# Patient Record
Sex: Female | Born: 1995 | Race: Black or African American | Hispanic: No | Marital: Single | Smoking: Never smoker
Health system: Southern US, Community
[De-identification: ages and names within clinical notes are randomized; demographics above are authoritative.]

## PROBLEM LIST (undated history)

## (undated) DIAGNOSIS — M419 Scoliosis, unspecified: Secondary | ICD-10-CM

## (undated) DIAGNOSIS — O24419 Gestational diabetes mellitus in pregnancy, unspecified control: Secondary | ICD-10-CM

## (undated) HISTORY — DX: Gestational diabetes mellitus in pregnancy, unspecified control: O24.419

## (undated) HISTORY — PX: NO PAST SURGERIES: SHX2092

---
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
## 2016-07-05 ENCOUNTER — Encounter (HOSPITAL_COMMUNITY): Payer: Self-pay | Admitting: *Deleted

## 2016-07-05 ENCOUNTER — Emergency Department (HOSPITAL_COMMUNITY)
Admission: EM | Admit: 2016-07-05 | Discharge: 2016-07-06 | Disposition: A | Discharge status: Home / Self Care | Payer: Self-pay | Attending: Emergency Medicine | Admitting: Emergency Medicine

## 2016-07-05 DIAGNOSIS — M4124 Other idiopathic scoliosis, thoracic region: Secondary | ICD-10-CM | POA: Insufficient documentation

## 2016-07-05 DIAGNOSIS — M546 Pain in thoracic spine: Secondary | ICD-10-CM

## 2016-07-05 DIAGNOSIS — X501XXA Overexertion from prolonged static or awkward postures, initial encounter: Secondary | ICD-10-CM | POA: Insufficient documentation

## 2016-07-05 DIAGNOSIS — M62830 Muscle spasm of back: Secondary | ICD-10-CM | POA: Insufficient documentation

## 2016-07-05 DIAGNOSIS — Y9389 Activity, other specified: Secondary | ICD-10-CM | POA: Insufficient documentation

## 2016-07-05 DIAGNOSIS — Y999 Unspecified external cause status: Secondary | ICD-10-CM | POA: Insufficient documentation

## 2016-07-05 DIAGNOSIS — Y929 Unspecified place or not applicable: Secondary | ICD-10-CM | POA: Insufficient documentation

## 2016-07-05 HISTORY — DX: Scoliosis, unspecified: M41.9

## 2016-07-05 NOTE — ED Triage Notes (Signed)
History of scoliosis and stretched her back today and now it is hurting

## 2016-07-06 MED ORDER — CYCLOBENZAPRINE HCL 10 MG PO TABS
5.0000 mg | ORAL_TABLET | Freq: Once | ORAL | Status: AC
  Administered 2016-07-06: 5 mg via ORAL
  Filled 2016-07-06: qty 1

## 2016-07-06 MED ORDER — CYCLOBENZAPRINE HCL 5 MG PO TABS: 5.0000 mg | ORAL_TABLET | Freq: Three times a day (TID) | ORAL | 0 refills | Status: DC | PRN

## 2016-07-06 MED ORDER — NAPROXEN 375 MG PO TABS: 375.0000 mg | ORAL_TABLET | Freq: Two times a day (BID) | ORAL | 0 refills | Status: DC

## 2016-07-06 NOTE — ED Provider Notes (Signed)
[facility]-EMERGENCY DEPT Provider Note   CSN: [id number] Arrival date & time: 07/05/16  2308     History   Chief Complaint Chief Complaint  Patient presents with  . Back Pain    HPI Meagan Richardson is a 20 y.o. female who presents to the ED with back pain. Patient reports that she has scoliosis and has chronic back pain. When she was 13 she went to her PCP and was referred to ortho to discuss possible surgery but patient states her mother never took her because she didn't want her to have surgery. Patient reports that today at work she was stretching   The history is provided by the patient. No language interpreter was used.  Back Pain   This is a new problem. The current episode started 3 to 5 hours ago. The problem has not changed since onset.The pain is present in the thoracic spine. The quality of the pain is described as stabbing and shooting. The pain does not radiate. The pain is at a severity of 10/10. The symptoms are aggravated by bending and twisting. The pain is the same all the time. Pertinent negatives include no chest pain, no fever, no headaches, no abdominal pain, no bowel incontinence, no bladder incontinence, no dysuria and no pelvic pain. She has tried nothing for the symptoms.    Past Medical History:  Diagnosis Date  . Scoliosis     There are no active problems to display for this patient.   History reviewed. No pertinent surgical history.  OB History    No data available       Home Medications    Prior to Admission medications   Medication Sig Start Date End Date Taking? Authorizing Provider  cyclobenzaprine (FLEXERIL) 5 MG tablet Take 1 tablet (5 mg total) by mouth 3 (three) times daily as needed for muscle spasms. 07/06/16   Hope Orlene OchM Neese, NP  naproxen (NAPROSYN) 375 MG tablet Take 1 tablet (375 mg total) by mouth 2 (two) times daily. 07/06/16   Hope Orlene OchM Neese, NP    Family History No family history on file.  Social History Social History    Substance Use Topics  . Smoking status: Never Smoker  . Smokeless tobacco: Never Used  . Alcohol use No     Allergies   Review of patient's allergies indicates no known allergies.   Review of Systems Review of Systems  Constitutional: Negative for chills and fever.  HENT: Negative.   Eyes: Negative for visual disturbance.  Respiratory: Negative for cough and wheezing.   Cardiovascular: Negative for chest pain.  Gastrointestinal: Negative for abdominal pain, bowel incontinence, nausea and vomiting.  Genitourinary: Negative for bladder incontinence, dysuria, pelvic pain and urgency.  Musculoskeletal: Positive for back pain.  Neurological: Negative for syncope and headaches.  Psychiatric/Behavioral: Negative for confusion.     Physical Exam Updated Vital Signs BP 133/79 (BP Location: Left Arm)   Pulse 82   Temp 98.3 F (36.8 C) (Oral)   Resp 18   Ht 5\' 2"  (1.575 m)   Wt 49.9 kg   LMP 06/28/2016   SpO2 100%   BMI 20.12 kg/m   Physical Exam  Constitutional: She is oriented to person, place, and time. She appears well-developed and well-nourished. No distress.  HENT:  Head: Normocephalic and atraumatic.  Right Ear: Tympanic membrane normal.  Left Ear: Tympanic membrane normal.  Nose: Nose normal.  Mouth/Throat: Uvula is midline, oropharynx is clear and moist and mucous membranes are normal.  Eyes:  EOM are normal.  Neck: Normal range of motion. Neck supple.  Cardiovascular: Normal rate and regular rhythm.   Pulmonary/Chest: Effort normal. She has no wheezes. She has no rales.  Abdominal: Soft. Bowel sounds are normal. There is no tenderness.  Musculoskeletal: Normal range of motion.       Back:  Scoliosis of the thoracic spine area with muscle spasm noted on the right. Tender with palpation.   Neurological: She is alert and oriented to person, place, and time. She has normal strength. No cranial nerve deficit or sensory deficit. Gait normal.  Reflex Scores:       Bicep reflexes are 2+ on the right side and 2+ on the left side.      Brachioradialis reflexes are 2+ on the right side and 2+ on the left side.      Patellar reflexes are 2+ on the right side and 2+ on the left side. Skin: Skin is warm and dry.  Psychiatric: She has a normal mood and affect. Her behavior is normal.  Nursing note and vitals reviewed.    ED Treatments / Results  Labs (all labs ordered are listed, but only abnormal results are displayed) Labs Reviewed - No data to display   Radiology No results found.  Procedures Procedures (including critical care time)  Medications Ordered in ED Medications  cyclobenzaprine (FLEXERIL) tablet 5 mg (5 mg Oral Given 07/06/16 0037)     Initial Impression / Assessment and Plan / ED Course  I have reviewed the triage vital signs and the nursing notes.  Clinical Course  20 y.o. female with right side thoracic back pain and scoliosis stable for d/c without neuro deficits. Will treat for muscle spasm and she will f/u with ortho for further evaluation of her scoliosis and chronic back pain. She will return here as needed.   Final Clinical Impressions(s) / ED Diagnoses   Final diagnoses:  Acute right-sided thoracic back pain  Other idiopathic scoliosis, thoracic region  Muscle spasm of back    New Prescriptions Discharge Medication List as of 07/06/2016 12:34 AM    START taking these medications   Details  cyclobenzaprine (FLEXERIL) 5 MG tablet Take 1 tablet (5 mg total) by mouth 3 (three) times daily as needed for muscle spasms., Starting Sun 07/06/2016, [address]Print         Hope M Neese, NP 07/07/16 [id number]    Shon Batonourtney F Horton, MD 07/09/16 [phone]

## 2016-07-06 NOTE — Discharge Instructions (Signed)
Do not drive while taking the muscle relaxant as it will make you sleepy. °

## 2016-08-28 ENCOUNTER — Other Ambulatory Visit: Payer: Self-pay | Admitting: Neurosurgery

## 2016-08-28 DIAGNOSIS — M41125 Adolescent idiopathic scoliosis, thoracolumbar region: Secondary | ICD-10-CM

## 2016-09-10 ENCOUNTER — Other Ambulatory Visit: Payer: Self-pay | Admitting: Neurosurgery

## 2016-09-10 DIAGNOSIS — M41125 Adolescent idiopathic scoliosis, thoracolumbar region: Secondary | ICD-10-CM

## 2016-09-12 ENCOUNTER — Ambulatory Visit
Admission: RE | Admit: 2016-09-12 | Discharge: 2016-09-12 | Disposition: A | Discharge status: Home / Self Care | Payer: Medicaid Other | Source: Ambulatory Visit | Attending: Neurosurgery | Admitting: Neurosurgery

## 2016-09-12 DIAGNOSIS — M41125 Adolescent idiopathic scoliosis, thoracolumbar region: Secondary | ICD-10-CM

## 2016-09-23 ENCOUNTER — Institutional Professional Consult (permissible substitution): Payer: Medicaid Other | Admitting: Internal Medicine

## 2016-10-09 ENCOUNTER — Other Ambulatory Visit (INDEPENDENT_AMBULATORY_CARE_PROVIDER_SITE_OTHER): Payer: Self-pay

## 2016-10-09 ENCOUNTER — Encounter: Payer: Self-pay | Admitting: Internal Medicine

## 2016-10-09 ENCOUNTER — Ambulatory Visit (INDEPENDENT_AMBULATORY_CARE_PROVIDER_SITE_OTHER)
Admission: RE | Admit: 2016-10-09 | Discharge: 2016-10-09 | Disposition: A | Discharge status: Home / Self Care | Payer: Self-pay | Source: Ambulatory Visit | Attending: Internal Medicine | Admitting: Internal Medicine

## 2016-10-09 ENCOUNTER — Ambulatory Visit (INDEPENDENT_AMBULATORY_CARE_PROVIDER_SITE_OTHER): Payer: Self-pay | Admitting: Internal Medicine

## 2016-10-09 VITALS — BP 116/70 | HR 65 | Ht 62.0 in | Wt 105.6 lb

## 2016-10-09 DIAGNOSIS — R0602 Shortness of breath: Secondary | ICD-10-CM

## 2016-10-09 LAB — CBC WITH DIFFERENTIAL/PLATELET
BASOS PCT: 1.3 % (ref 0.0–3.0)
Basophils Absolute: 0.1 10*3/uL (ref 0.0–0.1)
EOS PCT: 1 % (ref 0.0–5.0)
Eosinophils Absolute: 0.1 10*3/uL (ref 0.0–0.7)
HCT: 43.7 % (ref 36.0–49.0)
Hemoglobin: 14.2 g/dL (ref 12.0–16.0)
LYMPHS ABS: 2.2 10*3/uL (ref 0.7–4.0)
Lymphocytes Relative: 30.7 % (ref 24.0–48.0)
MCHC: 32.6 g/dL (ref 31.0–37.0)
MCV: 81.6 fl (ref 78.0–98.0)
MONOS PCT: 6.3 % (ref 3.0–12.0)
Monocytes Absolute: 0.4 10*3/uL (ref 0.1–1.0)
NEUTROS PCT: 60.7 % (ref 43.0–71.0)
Neutro Abs: 4.3 10*3/uL (ref 1.4–7.7)
Platelets: 315 10*3/uL (ref 150.0–575.0)
RBC: 5.35 Mil/uL (ref 3.80–5.70)
RDW: 14.7 % (ref 11.4–15.5)
WBC: 7.1 10*3/uL (ref 4.5–13.5)

## 2016-10-09 LAB — BASIC METABOLIC PANEL
BUN: 9 mg/dL (ref 6–23)
CHLORIDE: 109 meq/L (ref 96–112)
CO2: 25 meq/L (ref 19–32)
Calcium: 9.6 mg/dL (ref 8.4–10.5)
Creatinine, Ser: 0.94 mg/dL (ref 0.40–1.20)
GFR: 98.24 mL/min (ref >60.00)
GLUCOSE: 93 mg/dL (ref 70–99)
POTASSIUM: 3.7 meq/L (ref 3.5–5.1)
Sodium: 138 mEq/L (ref 135–145)

## 2016-10-09 LAB — BRAIN NATRIURETIC PEPTIDE: PRO B NATRI PEPTIDE: 12 pg/mL (ref 0.0–100.0)

## 2016-10-09 LAB — TSH: TSH: 0.98 u[IU]/mL (ref 0.40–5.00)

## 2016-10-09 NOTE — Progress Notes (Signed)
Subjective:     Patient ID: Meagan Richardson, female   DOB: 10/05/1995,    MRN: [id number]  HPI   [age] yobf never smoker with new onset sob 9th grade assoc with back pain referred to pulmonary clinic 10/09/2016 by Dr   Aquilla HackerGhobrial for doe.    10/09/2016 1st [facility] office visit/ Courtny Bennison   Chief Complaint  Patient presents with  . Pulmonary Consult    Referred by Dr. Dorinda HillGeorge Ghobrial. Pt c/o SOB since Oct 2017. She states SOB comes and goes and bothers her most when she stands.  She has also had some tingling in her legs.   onset during 9th grade with back pain [place], [place] re back and new sob when stand up variable At onset noted that sob occurred for first time in Oct 2017 with ? Pulled muscle and has happened repeatedly since then but avg once a month,  Not sure it ever happens with exertion because stopped exerting due to back problems but able to make it up steps fine/ no noct symptoms  No obvious day to day or daytime variability or assoc excess/ purulent sputum or mucus plugs or hemoptysis or cp or chest tightness, subjective wheeze or overt sinus or hb symptoms. No unusual exp hx or h/o childhood pna/ asthma or knowledge of premature birth.  Sleeping ok without nocturnal  or early am exacerbation  of respiratory  c/o's or need for noct saba. Also denies any obvious fluctuation of symptoms with weather or environmental changes or other aggravating or alleviating factors except as outlined above   Current Medications, Allergies, Complete Past Medical History, Past Surgical History, Family History, and Social History were reviewed in [facility] electronic [facility] record.  ROS  The following are not active complaints unless bolded sore throat, dysphagia, dental problems, itching, sneezing,  nasal congestion or excess/ purulent secretions, ear ache,   fever, chills, sweats, unintended wt loss, classically pleuritic or exertional cp,  orthopnea pnd or leg swelling, presyncope,  palpitations, abdominal pain, anorexia, nausea, vomiting, diarrhea  or change in bowel or bladder habits, change in stools or urine, dysuria,hematuria,  rash, arthralgias, visual complaints, headache, numbness, weakness or ataxia or problems with walking or coordination,  change in mood/affect or memory.          Review of Systems     Objective:   Physical Exam  amb bf nad/ ? Belle affect?    Wt Readings from Last 3 Encounters:  10/09/16 105 lb 9.6 oz (47.9 kg) (9 %, Z= -1.33)*  07/05/16 110 lb (49.9 kg) (17 %, Z= -0.97)*   * Growth percentiles are based on CDC 2-20 Years data.    Vital signs reviewed - Note on arrival 02 sats  95% on RA though difficult to read thru nails     HEENT: nl dentition, turbinates, and oropharynx. Nl external ear canals without cough reflex   NECK :  without JVD/Nodes/TM/ nl carotid upstrokes bilaterally   LUNGS: no acc muscle use,  Nl contour chest which is clear to A and P bilaterally without cough on insp or exp maneuvers   CV:  RRR  no s3 or murmur or increase in P2, nad no edema   ABD:  soft and nontender with nl inspiratory excursion in the supine position. No bruits or organomegaly appreciated, bowel sounds nl  MS:  Nl gait/ ext warm without deformities, calf tenderness, cyanosis or clubbing No obvious joint restrictions but obvious thoracic scoliosis   SKIN: warm and  dry without lesions    NEURO:  alert, approp, nl sensorium with  no motor or cerebellar deficits apparent.     CXR PA and Lateral:   10/09/2016 :    I personally reviewed images and agree with radiology impression as follows:    1. No active cardiopulmonary disease. No evidence of pneumonia or pulmonary edema. Lung volumes are normal. 2. Prominent scoliosis of the thoracic spine   Labs ordered/ reviewed:      Chemistry      Component Value Date/Time   NA 138 10/09/2016 1220   K 3.7 10/09/2016 1220   CL 109 10/09/2016 1220   CO2 25 10/09/2016 1220   BUN 9  10/09/2016 1220   CREATININE 0.94 10/09/2016 1220      Component Value Date/Time   CALCIUM 9.6 10/09/2016 1220        Lab Results  Component Value Date   WBC 7.1 10/09/2016   HGB 14.2 10/09/2016   HCT 43.7 10/09/2016   MCV 81.6 10/09/2016   PLT 315.0 10/09/2016     Lab Results  Component Value Date   DDIMER <0.19 10/09/2016      Lab Results  Component Value Date   TSH 0.98 10/09/2016     Lab Results  Component Value Date   PROBNP 12.0 10/09/2016          Assessment:

## 2016-10-09 NOTE — Patient Instructions (Addendum)
Please remember to go to the lab and x-ray department downstairs in the basement  for your tests - we will call you with the results when they are available.     Please schedule a follow up office visit in 4 weeks, sooner if needed with full pfts on return

## 2016-10-10 ENCOUNTER — Telehealth: Payer: Self-pay | Admitting: Internal Medicine

## 2016-10-10 LAB — D-DIMER, QUANTITATIVE: D-Dimer, Quant: <0.19 mcg/mL FEU (ref <0.50)

## 2016-10-10 NOTE — Assessment & Plan Note (Addendum)
10/09/2016  Walked RA x 3 laps @ 185 ft each stopped due to  End of study, rapid pace, no sob, unable to read sats due to nails - Spirometry 10/09/2016  FEV1 1.52 (55%)  Ratio 58 s rx but poor effort/ f/v not physiologic   Although she clearly has thoracic scoliosis, there is no basis for her complaint of sob on standing that resolves sitting and is not reproducible nor does it appear to affect ex tolerance so Symptoms are markedly disproportionate to objective findings and not clear this is a lung problem but pt does appear to have difficult airway management issues.   The differential diagnosis of difficult to control airways disorders is extensive with no quick and easy answers but easy to remember because it consists of 13 A's,  Two Bs and one C: 1. Adherence, always a challenge and the leading suspect.> not an issue here since not on meds 2. Acid reflux disease, with the greater proportion of pulmonary patients with no overt heartburn symptoms, and no easy way to treat non-acid reflux> unlikely clinically  3. Ace inhibitor use>  n/a 4. Active sinus dz, best addressed by a sinus ct> unlikely s assoc cough  5. Active smoking,  Usually sureptitious in this setting> n/a 6. Allergic diseases, usually with a hx dating back to childhood with prominent allergic rhinitis features in up 90% of pts> unlikely s assoc cough  7. Aspiration, a perennial problem in the elderly or other patients at risk> unlikely s assoc cough  8. Allergic Bronchopulmonary Aspergillosis, associated with IgE's in the thousands> unlikely s asthma hx  9. Alpha one Antitrypsin deficiency, a must screen in patients with chronic airflow obstruction syndromes out of proportion to smoking history > n/a 10. Adverse effect of inhalers, especially DPI's and especially with poor inhaler technique> n/a 11  ? Anxiety/depression> deconditioning  > usually at the bottom of this list of usual suspects but should be much higher on this pt's based on  Hx.  12. A bunch of PE's ie moderately large clot burden, a few small ones peripherally can cause pleuritic cp syndromes but not unexplained dyspnea> D dimer nl - while  A nl valute  may miss small peripheral pe, the clot burden with sob is moderately high and the d dimer has a very high neg pred value in this setting   13 Anemia or Thyroid disorders, easily excluded with standard labs but frequently overlooked in the chronically symptomatic/ frequent return pt > ruled out by today's labs Two B's 1. Bronchiectasis:  Pos CT is the sine que non here 2  Beta blocker effects:  Coreg and Timolol use are pervasive in the adult population and both have significant spillover effects on the airways> n/a One C 1. Congestive heart failure,easily  ruled out now with BNP level of < 100 when symptomatic    She needs to be more active and return for formal pfts in one month to sort out restrictive vs obst components to her pfts and repeat walking sats with accurate oximetry   Total time devoted to counseling  > 50 % of initial 60 min office visit:  review case with pt/Mom discussion of options/alternatives/ personally creating written customized instructions  in presence of pt  then going over those specific  Instructions directly with the pt including how to use all of the meds but in particular covering each new medication in detail and the difference between the maintenance= "automatic" meds and the prns using an  action plan format for the latter (If this problem/symptom => do that organization reading Left to right).  Please see AVS from this visit for a full list of these instructions which I personally wrote for this pt and  are unique to this visit.

## 2016-10-10 NOTE — Telephone Encounter (Signed)
Spoke with the pt's mother and notified of lab and cxr results

## 2016-10-10 NOTE — Telephone Encounter (Signed)
Last ov note has been faxed to [facility].  Nothing further needed.

## 2016-10-10 NOTE — Progress Notes (Signed)
Spoke with pt's mother and notified of results per Dr. Wert. She verbalized understanding and denied any questions. 

## 2016-10-10 NOTE — Progress Notes (Signed)
LMTCB

## 2016-10-10 NOTE — Progress Notes (Signed)
Already LMTCB

## 2016-11-25 ENCOUNTER — Ambulatory Visit: Payer: Medicaid Other | Admitting: Internal Medicine

## 2016-12-18 ENCOUNTER — Ambulatory Visit: Payer: Medicaid Other | Attending: Neurosurgery | Admitting: Physical Therapy

## 2016-12-18 ENCOUNTER — Encounter: Payer: Self-pay | Admitting: Physical Therapy

## 2016-12-18 DIAGNOSIS — G8929 Other chronic pain: Secondary | ICD-10-CM | POA: Insufficient documentation

## 2016-12-18 DIAGNOSIS — M62830 Muscle spasm of back: Secondary | ICD-10-CM | POA: Insufficient documentation

## 2016-12-18 DIAGNOSIS — R293 Abnormal posture: Secondary | ICD-10-CM | POA: Insufficient documentation

## 2016-12-18 DIAGNOSIS — M6281 Muscle weakness (generalized): Secondary | ICD-10-CM | POA: Insufficient documentation

## 2016-12-18 DIAGNOSIS — M5440 Lumbago with sciatica, unspecified side: Secondary | ICD-10-CM | POA: Insufficient documentation

## 2016-12-18 DIAGNOSIS — M546 Pain in thoracic spine: Secondary | ICD-10-CM

## 2016-12-18 NOTE — Therapy (Signed)
[facility] Outpatient Rehabilitation [facility] [address] [place], [place], [address] Phone: [phone]   Fax:  [phone]  Physical Therapy Evaluation  Patient Details  Name: Meagan Richardson MRN: [id number] Date of Birth: [birth year] Referring Provider: Darryll Capers MD  Encounter Date: 12/18/2016      PT End of Session - 12/18/16 1607    Visit Number 1   Number of Visits 16   Date for PT Re-Evaluation 02/12/17   Authorization Type Medicaid   Authorization - Visit Number 1   Authorization - Number of Visits 8   PT Start Time 0300   PT Stop Time 0406   PT Time Calculation (min) 66 min   Activity Tolerance Patient tolerated treatment well   Behavior During Therapy [facility] for tasks assessed/performed      Past Medical History:  Diagnosis Date  . Scoliosis     History reviewed. No pertinent surgical history.  There were no vitals filed for this visit.       Subjective Assessment - 12/18/16 1508    Subjective I have pain every time I bend down or pick stuff up from ground.  I stopped working due to my pain in my back.   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  Got fitted for a brace that she was supporsed to use for 18 hours a day, but MD said not to wear it if it was bothering. her   Patient is accompained by: Family member  mom   Limitations Sitting   How long can you sit comfortably? < 5 minutes   How long can you stand comfortably? 15 -30 minutes   How long can you walk comfortably? 15- 30 minutes   Diagnostic tests x rays < MRI   Currently in Pain? Yes   Pain Score 7    Pain Location Back   Pain Orientation Right   Pain Descriptors / Indicators Aching;Tightness   Pain Type Chronic pain   Pain Onset More than a month ago   Pain Frequency Intermittent   Aggravating Factors  sitting for long time , bending over, picking up boxes    Pain Relieving Factors medicine            [facility] PT Assessment - 12/18/16 1515      Assessment   Medical Diagnosis scoliosis with lumbar and thoracic curves   Referring Provider Darryll Capers MD   Onset Date/Surgical Date --  > 2 years ago   Hand Dominance Right   Next MD Visit August 2018   Prior Therapy NONE     Precautions   Precautions None     Restrictions   Weight Bearing Restrictions No     Balance Screen   Has the patient fallen in the past 6 months No   Has the patient had a decrease in activity level because of a fear of falling?  No   Is the patient reluctant to leave their home because of a fear of falling?  No     Prior Function   Level of Independence Independent   Vocation Other (comment)  not working presently     Copy Status Within Functional Limits for tasks assessed     Observation/Other Assessments   Observations Pt sacral sits and leans to left side compressing right thoracic side   Focus on Therapeutic Outcomes (FOTO)  FOTO INtake 74%  limitation 26%  predicted  24%     Posture/Postural Control   Posture  Comments ectomorphic body type      AROM   Overall AROM  Deficits   Lumbar Flexion 50  painful   Lumbar Extension 25   Lumbar - Right Side Bend 21   Lumbar - Left Side Bend 40   Lumbar - Right Rotation 75%   Lumbar - Left Rotation 60%     Strength   Overall Strength Deficits   Overall Strength Comments grossly 4 to 4+/5 in UE and LE strength limited by pain     Palpation   Spinal mobility hypermobility of spine   Palpation comment pt with spasm over thoracic and lumbar paraspinal with increased spasm over right thoracic paraspinal and tenderness over right lumbosacral area.  Back scratch test. R over left = 2 inches. L overR fingertou                   [facility] Adult PT Treatment/Exercise - 12/18/16 1515      Self-Care   Self-Care Other Self-Care Comments   Other Self-Care Comments  explanation of scoliosis and need for lengthening instead of compressive exercises for strength.  Pt shown examples,   also educated on trigger point dry needling. and given handouts.   intial posture     Lumbar Exercises: Stretches   Quadruped Mid Back Stretch 3 reps;30 seconds   Quadruped Mid Back Stretch Limitations relief of pain     Lumbar Exercises: Sidelying   Other Sidelying Lumbar Exercises right side plank with breathing into left thoracic concavity x 20 with 10 second hold  spine aligned          Trigger Point Dry Needling - 12/18/16 1544    Consent Given? Yes   Education Handout Provided Yes   Muscles Treated Upper Body Longissimus  Erector spinae T-7 to 12   Longissimus Response Twitch response elicited;Palpable increased muscle length  right side only lumbo sacral right twitch response              PT Education - 12/18/16 1615    Education provided Yes   Education Details POC Explanation of findings, education on trigger point dry needling, childs pose and 1/2 plank on right side only with explanation of lengthening for scoliosis, posture/sitting and standing.    Person(s) Educated Patient   Methods Explanation;Demonstration;Verbal cues;Handout   Comprehension Verbalized understanding;Returned demonstration          PT Short Term Goals - 12/18/16 1621      PT SHORT TERM GOAL #1   Title "Independent with initial HEP   Baseline 01-15-17   No knowledge   Time 4   Period Weeks   Status New     PT SHORT TERM GOAL #2   Title "Report pain decrease from 7/10 to  4 /10. at rest when sitting for more than 30 minutes   Baseline Pt cannot sit more than 5 minutes without 5-7/ pain and needing to shift   Time 4   Period Weeks   Status New     PT SHORT TERM GOAL #3   Title Pt will be introduced to Pilates for strengthening and lengthening of scoliotic curve   Baseline No knowledge of strengthening and lengthening program   Time 4   Period Weeks   Status New           PT Long Term Goals - 12/18/16 1607      PT LONG TERM GOAL #1   Title "Demonstrate and verbalize  techniques to reduce the risk of  re-injury including: lifting, posture, body mechanics. 02-12-17   Baseline Pt sits with sacral sitting and forward head and compressed right side while leaning on left for comfort   Time 8   Period Weeks   Status New     PT LONG TERM GOAL #2   Title "Pt will be independent with advanced HEP.    Baseline NO knowledge   Time 8   Period Weeks   Status New     PT LONG TERM GOAL #3   Title "Demonstrate understanding of proper sitting posture and be more conscious of position and posture throughout the day.    Baseline Pt has typical teenage posture with flexion and compression of spine   Time 8   Period Weeks   Status New     PT LONG TERM GOAL #4   Title Pt will be able to manage pain using pain management strategies to prepare sitting and driving for greater than 1 hour   Baseline Pt has no knowledge of pain management   Time 8   Period Weeks   Status New     PT LONG TERM GOAL #5   Title Pt will tolerate standing and walking for 2 hours in order to return to work environment   Baseline Pt can only stand for 15 to 30 minutes without 7/10 pain   Time 8   Period Weeks   Status New     Additional Long Term Goals   Additional Long Term Goals Yes     PT LONG TERM GOAL #6   Title "FOTO will improve from 26%limitation   to  20%   indicating improved functional mobility   Baseline EVAL 26% limitation   Time 8   Period Weeks   Status New     PT LONG TERM GOAL #7   Title Pt will be familiar with community wellness for continuing strength post PT Rehab for scoliosis   Baseline Pt has no knowleged of proper exercise for scoliosis   Time 8   Period Weeks   Status New               Plan - 12/18/16 1707    Clinical Impression Statement 21 yo presents with low complexity evaluation of thoracic and low back pain due to dextroscoliosis of thoracic spine and levo scoliosisi of lumbar curver with elevated Quadratus lumborum of right side.  CPT code (  thoracic pain M54.6) and lumbar M54.5. Pt has pain when bending over with limited AROM and when picking up items.  Pt had to quit warehouse job due to pain.  Pt also has spasm of bil paraspinals with marked spasm R> L.  Pt exhibits symptoms of hypermobility and will benefit from a strengthening program to maximize function.  Pt exhibits poor posture with forward head and anterior tilt of pelvis and sacral sits and compresses right side by leaning on left side. Pt can only sit 5 minutes without shifting due to pain and walk/stand 15 - because of pain.  Pt will benefit from strengtheing with Pilates for lifelong fitness. and skilled PT with dry needling to alleviate muscle spasms.     Rehab Potential Good   PT Frequency 2x / week   PT Duration 8 weeks   PT Treatment/Interventions Taping;Dry needling;Manual techniques;Passive range of motion;Patient/family education;Neuromuscular re-education;Therapeutic exercise;Therapeutic activities;Electrical Stimulation;Moist Heat;Cryotherapy;Iontophoresis /ml Dexamethasone;Ultrasound   PT Next Visit Plan asses dry needling.  progress exercises for strengthening   PT Home Exercise Plan childs pose and  right side plank with breathing in to concavitiy   Consulted and Agree with Plan of Care Patient      Patient will benefit from skilled therapeutic intervention in order to improve the following deficits and impairments:  Pain, Improper body mechanics, Postural dysfunction, Increased muscle spasms, Hypermobility, Decreased strength, Decreased range of motion  Visit Diagnosis: Pain in thoracic spine  Chronic left-sided low back pain with sciatica, sciatica laterality unspecified  Abnormal posture  Muscle weakness (generalized)  Muscle spasm of back      G-Codes - Jan 05, 2017 1606    Functional Assessment Tool Used (Outpatient Only) FOTO   Functional Limitation Changing and maintaining body position   Changing and Maintaining Body Position Current  Status (Z6109) At least 20 percent but less than 40 percent impaired, limited or restricted  26%   Changing and Maintaining Body Position Goal Status (U0454) At least 1 percent but less than 20 percent impaired, limited or restricted  20%       Problem List Patient Active Problem List   Diagnosis Date Noted  . Shortness of breath 10/09/2016    Garen Lah, PT Certified Exercise Expert for the Aging Adult  01/05/2017 5:24 PM Phone: [phone] Fax: [phone]  [facility] Outpatient Rehabilitation [facility] [address] [place], [place], [address] Phone: [phone]   Fax:  [phone]  Name: Meagan Richardson MRN: [id number] Date of Birth: 1995-09-24

## 2016-12-18 NOTE — Patient Instructions (Addendum)
Posture Tips DO: - stand tall and erect - keep chin tucked in - keep head and shoulders in alignment - check posture regularly in mirror or large window - pull head back against headrest in car seat;  Change your position often.  Sit with lumbar support. DON'T: - slouch or slump while watching TV or reading - sit, stand or lie in one position  for too long;  Sitting is especially hard on the spine so if you sit at a desk/use the computer, then stand up often!   Copyright  VHI. All rights reserved.  Posture - Standing   Good posture is important. Avoid slouching and forward head thrust. Maintain curve in low back and align ears over shoul- ders, hips over ankles.  Pull your belly button in toward your back bone. Stand with even weight in heel and toes. Ribs lifted up like a golden thread from your sternum to the sky.  Chin tucked down.   Copyright  VHI. All rights reserved.  Posture - Sitting   Sit upright, head facing forward. Try using a roll to support lower back. Keep shoulders relaxed, and avoid rounded back. Keep hips level with knees. Avoid crossing legs for long periods. Sit on sit bones not tail bone.  Do not sit one side all the time . Switch sides from leaning to left   Copyright  VHI. All rights reserved.   Oblique Abdominal Side Plank: Lowering (Eccentric)    Lie on side on feet and elbow right side with elbow,. Lift trunk. Breathe into concavity of your upper back.  Fill your lungs with air and hold for 5-10 seconds.   _2 x _10_ reps per set, _2__ times  per day, ___ days per week. Progress to lifting arm.  Copyright  VHI. All rights reserved.   BACK: Child's Pose (Sciatica)    Sit in knee-chest position and reach arms forward. Separate knees for comfort. Hold position  For 30 seconds Repeat __3_ times. Do __2_ times per day. Or use when  Back is aching.  Copyright  VHI. All rights reserved.   Trigger Point Dry Needling  . What is Trigger Point Dry Needling  (DN)? o DN is a physical therapy technique used to treat muscle pain and dysfunction. Specifically, DN helps deactivate muscle trigger points (muscle knots).  o A thin filiform needle is used to penetrate the skin and stimulate the underlying trigger point. The goal is for a local twitch response (LTR) to occur and for the trigger point to relax. No medication of any kind is injected during the procedure.   . What Does Trigger Point Dry Needling Feel Like?  o The procedure feels different for each individual patient. Some patients report that they do not actually feel the needle enter the skin and overall the process is not painful. Very mild bleeding may occur. However, many patients feel a deep cramping in the muscle in which the needle was inserted. This is the local twitch response.   Marland Kitchen How Will I feel after the treatment? o Soreness is normal, and the onset of soreness may not occur for a few hours. Typically this soreness does not last longer than two days.  o Bruising is uncommon, however; ice can be used to decrease any possible bruising.  o In rare cases feeling tired or nauseous after the treatment is normal. In addition, your symptoms may get worse before they get better, this period will typically not last longer than 24 hours.   Marland Kitchen  What Can I do After My Treatment? o Increase your hydration by drinking more water for the next 24 hours. o You may place ice or heat on the areas treated that have become sore, however, do not use heat on inflamed or bruised areas. Heat often brings more relief post needling. o You can continue your regular activities, but vigorous activity is not recommended initially after the treatment for 24 hours. o DN is best combined with other physical therapy such as strengthening, stretching, and other therapies.    Garen Lah, PT Certified Exercise Expert for the Aging Adult  12/18/16 3:53 PM Phone: [phone] Fax: [phone]

## 2017-01-01 ENCOUNTER — Ambulatory Visit: Payer: Medicaid Other | Admitting: Physical Therapy

## 2017-01-01 DIAGNOSIS — G8929 Other chronic pain: Secondary | ICD-10-CM

## 2017-01-01 DIAGNOSIS — M5440 Lumbago with sciatica, unspecified side: Secondary | ICD-10-CM

## 2017-01-01 DIAGNOSIS — M6281 Muscle weakness (generalized): Secondary | ICD-10-CM

## 2017-01-01 DIAGNOSIS — M546 Pain in thoracic spine: Secondary | ICD-10-CM

## 2017-01-01 DIAGNOSIS — R293 Abnormal posture: Secondary | ICD-10-CM

## 2017-01-01 DIAGNOSIS — M62830 Muscle spasm of back: Secondary | ICD-10-CM

## 2017-01-01 NOTE — Therapy (Signed)
[facility] Outpatient Rehabilitation [facility] [address] [place], [place], [address] Phone: [phone]   Fax:  [phone]  Physical Therapy Treatment  Patient Details  Name: Meagan Richardson MRN: [id number] Date of Birth: 11/12/1995 Referring Provider: Tressie Stalker MD  Encounter Date: 01/01/2017      PT End of Session - 01/01/17 1331    Visit Number 2   Number of Visits 16   Date for PT Re-Evaluation 02/12/17   Authorization Type Medicaid   Authorization - Visit Number 2   Authorization - Number of Visits 8   PT Start Time 1330   PT Stop Time 1425   PT Time Calculation (min) 55 min   Activity Tolerance Patient tolerated treatment well   Behavior During Therapy [facility] for tasks assessed/performed      Past Medical History:  Diagnosis Date  . Scoliosis     No past surgical history on file.  There were no vitals filed for this visit.      Subjective Assessment - 01/01/17 1332    Subjective (P)  I am having pain in low back today.  I am trying to use better posture but I forget sometimes.  no pain in upper back   Patient is accompained by: (P)  Family member   Limitations (P)  Sitting   Currently in Pain? (P)  Yes   Pain Score (P)  6    Pain Location (P)  --  low back today   Pain Orientation (P)  Right   Pain Type (P)  Chronic pain   Pain Onset (P)  More than a month ago   Pain Frequency (P)  Intermittent                         [facility] Adult PT Treatment/Exercise - 01/01/17 1359      Lumbar Exercises: Stretches   Quadruped Mid Back Stretch 3 reps;30 seconds   Quadruped Mid Back Stretch Limitations relief of pain     Lumbar Exercises: Standing   Other Standing Lumbar Exercises right thoracic Scoliosis stretch 10 x at counter level with VC and TC , prayer stretch 3 x 30 sec    Other Standing Lumbar Exercises standing schroth L stretch with low row and serratus engagmeent on right  for 4 minutes      Lumbar Exercises:  Supine   Other Supine Lumbar Exercises pelvic tilst x 10 ,  Ab set 10 sec hold x 10     Lumbar Exercises: Sidelying   Other Sidelying Lumbar Exercises right side plank with breathing into left thoracic concavity x 20 with 10 second hold  spine aligned     Lumbar Exercises: Prone   Other Prone Lumbar Exercises prone alternating arm and leg x 15   attempted superman but caused low back pain at 2 repetitions so was discontiuned     Moist Heat Therapy   Number Minutes Moist Heat 15 Minutes   Moist Heat Location Lumbar Spine  right side lying left QL with moist heat     Manual Therapy   Manual Therapy Soft tissue mobilization;Myofascial release   Manual therapy comments marked response to QL needling   Soft tissue mobilization right sidelying QL and paraspinal   Myofascial Release in right sidelying, MFR ot left quadratus lumborum          Trigger Point Dry Needling - 01/01/17 1359    Consent Given? Yes   Education Handout Provided No  previously given   Muscles Treated Upper Body Quadratus Lumborum;Longissimus  left side only RL4/l5 l left L1 to L-3 marked response   Muscles Treated Lower Body --  QL right twitch   Longissimus Response Twitch response elicited;Palpable increased muscle length              PT Education - 01/01/17 1358    Education provided Yes   Education Details given HEP for strength and stretch for scoliosis  education on trigger point dry needling with education onprecautioans and aftercare   Person(s) Educated Patient   Methods Explanation;Demonstration;Tactile cues;Verbal cues;Handout   Comprehension Verbalized understanding;Returned demonstration          PT Short Term Goals - 01/01/17 1651      PT SHORT TERM GOAL #1   Title "Independent with initial HEP   Baseline 01-15-17   No knowledge given additional HEP   Time 4   Period Weeks   Status On-going     PT SHORT TERM GOAL #2   Title "Report pain decrease from 7/10 to  4 /10. at rest  when sitting for more than 30 minutes   Baseline Pt cannot sit more than 5 minutes without 5-7/ pain and needing to shift, pt at 5/10 today in low back and no pain in thoracic   Time 4   Period Weeks   Status On-going     PT SHORT TERM GOAL #3   Title Pt will be introduced to Pilates for strengthening and lengthening of scoliotic curve   Baseline No knowledge of strengthening and lengthening program   Time 4   Period Weeks   Status On-going           PT Long Term Goals - 12/18/16 1607      PT LONG TERM GOAL #1   Title "Demonstrate and verbalize techniques to reduce the risk of re-injury including: lifting, posture, body mechanics. 02-12-17   Baseline Pt sits with sacral sitting and forward head and compressed right side while leaning on left for comfort   Time 8   Period Weeks   Status New     PT LONG TERM GOAL #2   Title "Pt will be independent with advanced HEP.    Baseline NO knowledge   Time 8   Period Weeks   Status New     PT LONG TERM GOAL #3   Title "Demonstrate understanding of proper sitting posture and be more conscious of position and posture throughout the day.    Baseline Pt has typical teenage posture with flexion and compression of spine   Time 8   Period Weeks   Status New     PT LONG TERM GOAL #4   Title Pt will be able to manage pain using pain management strategies to prepare sitting and driving for greater than 1 hour   Baseline Pt has no knowledge of pain management   Time 8   Period Weeks   Status New     PT LONG TERM GOAL #5   Title Pt will tolerate standing and walking for 2 hours in order to return to work environment   Baseline Pt can only stand for 15 to 30 minutes without 7/10 pain   Time 8   Period Weeks   Status New     Additional Long Term Goals   Additional Long Term Goals Yes     PT LONG TERM GOAL #6   Title "FOTO will improve from 26%limitation   to  20%   indicating improved functional mobility   Baseline EVAL 26%  limitation   Time 8   Period Weeks   Status New     PT LONG TERM GOAL #7   Title Pt will be familiar with community wellness for continuing strength post PT Rehab for scoliosis   Baseline Pt has no knowleged of proper exercise for scoliosis   Time 8   Period Weeks   Status New               Plan - 01/01/17 1648    Clinical Impression Statement 21 yo Ms. Moncrief returns with no upper back pain after 1 TDN session.  but does have 5/10 back pain lower bil and with left Quadratus lumborum.  Pt /mother shown HEP and then consented to second TDN session.  Pt was constantly monitored throughout session and had marked twitch response of left quadtratus lumborum.  Pt with palpable lengthening at end of session but still with lingering pain.  will assess and progress toward goals.   Rehab Potential Good   PT Frequency 2x / week   PT Duration 8 weeks   PT Treatment/Interventions Taping;Dry needling;Manual techniques;Passive range of motion;Patient/family education;Neuromuscular re-education;Therapeutic exercise;Therapeutic activities;Electrical Stimulation;Moist Heat;Cryotherapy;Iontophoresis /ml Dexamethasone;Ultrasound   PT Next Visit Plan  progress exercises for strengthening,assess TDN   PT Home Exercise Plan childs pose and right side plank with breathing in to concavitiy see Exercises given   Consulted and Agree with Plan of Care Patient;Family member/caregiver      Patient will benefit from skilled therapeutic intervention in order to improve the following deficits and impairments:  Pain, Improper body mechanics, Postural dysfunction, Increased muscle spasms, Hypermobility, Decreased strength, Decreased range of motion  Visit Diagnosis: Pain in thoracic spine  Chronic left-sided low back pain with sciatica, sciatica laterality unspecified  Abnormal posture  Muscle weakness (generalized)  Muscle spasm of back     Problem List Patient Active Problem List   Diagnosis  Date Noted  . Shortness of breath 10/09/2016    Garen Lah, PT Certified Exercise Expert for the Aging Adult  01/01/17 4:53 PM Phone: [phone] Fax: [phone]  [facility] Outpatient Rehabilitation [facility] [address] [place], [place], [address] Phone: [phone]   Fax:  [phone]  Name: Meagan Richardson MRN: [id number] Date of Birth: [birth year]

## 2017-01-01 NOTE — Patient Instructions (Signed)
 .    Trigger Point Dry Needling  . What is Trigger Point Dry Needling (DN)? o DN is a physical therapy technique used to treat muscle pain and dysfunction. Specifically, DN helps deactivate muscle trigger points (muscle knots).  o A thin filiform needle is used to penetrate the skin and stimulate the underlying trigger point. The goal is for a local twitch response (LTR) to occur and for the trigger point to relax. No medication of any kind is injected during the procedure.   . What Does Trigger Point Dry Needling Feel Like?  o The procedure feels different for each individual patient. Some patients report that they do not actually feel the needle enter the skin and overall the process is not painful. Very mild bleeding may occur. However, many patients feel a deep cramping in the muscle in which the needle was inserted. This is the local twitch response.   Marland Kitchen How Will I feel after the treatment? o Soreness is normal, and the onset of soreness may not occur for a few hours. Typically this soreness does not last longer than two days.  o Bruising is uncommon, however; ice can be used to decrease any possible bruising.  o In rare cases feeling tired or nauseous after the treatment is normal. In addition, your symptoms may get worse before they get better, this period will typically not last longer than 24 hours.   . What Can I do After My Treatment? o Increase your hydration by drinking more water for the next 24 hours. o You may place ice or heat on the areas treated that have become sore, however, do not use heat on inflamed or bruised areas. Heat often brings more relief post needling. o You can continue your regular activities, but vigorous activity is not recommended initially after the treatment for 24 hours. o DN is best combined with other physical therapy such as strengthening, stretching, and other therapies.   Garen Lah, PT Certified Exercise Expert for the Aging Adult    01/01/17 1:55 PM Phone: [phone] Fax: [phone]

## 2017-01-08 ENCOUNTER — Encounter: Payer: Self-pay | Admitting: Physical Therapy

## 2017-01-08 ENCOUNTER — Ambulatory Visit: Payer: Medicaid Other | Attending: Neurosurgery | Admitting: Physical Therapy

## 2017-01-08 DIAGNOSIS — R293 Abnormal posture: Secondary | ICD-10-CM | POA: Insufficient documentation

## 2017-01-08 DIAGNOSIS — G8929 Other chronic pain: Secondary | ICD-10-CM

## 2017-01-08 DIAGNOSIS — M5440 Lumbago with sciatica, unspecified side: Secondary | ICD-10-CM | POA: Insufficient documentation

## 2017-01-08 DIAGNOSIS — M6281 Muscle weakness (generalized): Secondary | ICD-10-CM | POA: Insufficient documentation

## 2017-01-08 DIAGNOSIS — M62830 Muscle spasm of back: Secondary | ICD-10-CM | POA: Insufficient documentation

## 2017-01-08 DIAGNOSIS — M546 Pain in thoracic spine: Secondary | ICD-10-CM | POA: Insufficient documentation

## 2017-01-08 NOTE — Therapy (Signed)
[facility] [address] [place], [place], [address] Phone: [phone]   Fax:  [phone]  Physical Therapy Treatment  Patient Details  Name: Meagan Richardson MRN: [id number] Date of Birth: 03/06/1996 Referring Provider: Tressie Stalkerjenkins, Jeffrey MD  Encounter Date: 01/08/2017      PT End of Session - 01/08/17 1738    Visit Number 3   Number of Visits 16   Date for PT Re-Evaluation 02/12/17   Authorization Type Medicaid   Authorization - Visit Number 3   Authorization - Number of Visits 16   PT Start Time 0300   PT Stop Time 0345   PT Time Calculation (min) 45 min   Activity Tolerance Patient tolerated treatment well   Behavior During Therapy [facility] for tasks assessed/performed      Past Medical History:  Diagnosis Date  . Scoliosis     History reviewed. No pertinent surgical history.  There were no vitals filed for this visit.      Subjective Assessment - 01/08/17 1506    Subjective I dont want to do TDN today.  I do my exercises every day. I did have tingling down my leg yesterday    Patient is accompained by: Family member  mom   How long can you sit comfortably? < 5 minutes   How long can you stand comfortably? 15 -30 minutes   How long can you walk comfortably? 15- 30 minutes   Diagnostic tests x rays < MRI   Currently in Pain? Yes   Pain Score 3    Pain Location Back   Pain Descriptors / Indicators Aching;Tightness   Pain Type Chronic pain   Pain Onset More than a month ago   Pain Frequency Intermittent                         [facility] Adult PT Treatment/Exercise - 01/08/17 1505      Lumbar Exercises: Stretches   Quadruped Mid Back Stretch 3 reps;30 seconds   Quadruped Mid Back Stretch Limitations no pain     Lumbar Exercises: Standing   Other Standing Lumbar Exercises standing schroth L stretch with low row and serratus engagmeent on right  for 3 minutes      Lumbar Exercises: Supine   Other Supine Lumbar Exercises physio ball ham curls and bridge x 20,    Other Supine Lumbar Exercises pelvic tilt x 10 and transfer to standing x 10 without exacerbation of pain.     Lumbar Exercises: Sidelying   Other Sidelying Lumbar Exercises right side plank with breathing into left thoracic concavity x 20 with 10 second hold  spine aligned     Lumbar Exercises: Prone   Single Arm Raises Limitations physio ball, prone bil arm extension and bil rows with lower trap activiation   Other Prone Lumbar Exercises downward dog to full arm plank  x 20    Other Prone Lumbar Exercises split stance overhead stance with 2 lb x 20 , step down with one arm reach with left side long side and arm raise x 20,                   PT Short Term Goals - 01/01/17 1651      PT SHORT TERM GOAL #1   Title "Independent with initial HEP   Baseline 01-15-17   No knowledge given additional HEP   Time 4   Period Weeks   Status On-going  PT SHORT TERM GOAL #2   Title "Report pain decrease from 7/10 to  4 /10. at rest when sitting for more than 30 minutes   Baseline Pt cannot sit more than 5 minutes without 5-7/ pain and needing to shift, pt at 5/10 today in low back and no pain in thoracic   Time 4   Period Weeks   Status On-going     PT SHORT TERM GOAL #3   Title Pt will be introduced to Pilates for strengthening and lengthening of scoliotic curve   Baseline No knowledge of strengthening and lengthening program   Time 4   Period Weeks   Status On-going           PT Long Term Goals - 01/08/17 1734      PT LONG TERM GOAL #1   Title "Demonstrate and verbalize techniques to reduce the risk of re-injury including: lifting, posture, body mechanics. 02-12-17   Baseline Pt utililzing better posture throughout day and is more aware   Time 8   Period Weeks   Status On-going     PT LONG TERM GOAL #2   Title "Pt will be independent with advanced HEP.    Baseline Pt given progressive HEP    Time 8   Period Weeks   Status On-going     PT LONG TERM GOAL #3   Title "Demonstrate understanding of proper sitting posture and be more conscious of position and posture throughout the day.    Baseline PT utilizing proper sitting and standing posture  but stands with hyper extension/increased lordosis   Time 8   Period Weeks   Status On-going     PT LONG TERM GOAL #4   Title Pt will be able to manage pain using pain management strategies to prepare sitting and driving for greater than 1 hour   Baseline Pt has recieved intiial education on pain managment, utilizign lumbar roll   Time 8   Period Weeks   Status On-going     PT LONG TERM GOAL #5   Title Pt will tolerate standing and walking for 2 hours in order to return to work environment   Baseline Pt can only stand for 15 to 30 minutes now with 3/10 pain   Time 8   Period Weeks   Status On-going     PT LONG TERM GOAL #6   Title "FOTO will improve from 26%limitation   to  20%   indicating improved functional mobility   Baseline EVAL 26% limitation   Period Weeks   Status Unable to assess     PT LONG TERM GOAL #7   Title Pt will be familiar with community wellness for continuing strength post PT Rehab for scoliosis   Baseline advancing HEP    Time 8   Period Weeks   Status On-going               Plan - 01/08/17 1724    Clinical Impression Statement Ms Gouin returns with 3/10 back pain and had tingling down leg.  Pt was reviewed on exercises for proper form.  Added to HEP with lengthening and elongating of spine strengthening exercises .  Pt participated in exercises and declined TDN today  . Pt with no pain at end of session   Rehab Potential Good   PT Frequency 2x / week   PT Duration 8 weeks   PT Treatment/Interventions Taping;Dry needling;Manual techniques;Passive range of motion;Patient/family education;Neuromuscular re-education;Therapeutic exercise;Therapeutic activities;Electrical Stimulation;Moist  Heat;Cryotherapy;Iontophoresis  4mg /ml Dexamethasone;Ultrasound   PT Next Visit Plan  progress exercises for strengthening, check goals   PT Home Exercise Plan childs pose and right side plank with breathing in to concavitiy see Exercises given, downward dog to arm extension plank   Consulted and Agree with Plan of Care Patient;Family member/caregiver      Patient will benefit from skilled therapeutic intervention in order to improve the following deficits and impairments:  Pain, Improper body mechanics, Postural dysfunction, Increased muscle spasms, Hypermobility, Decreased strength, Decreased range of motion  Visit Diagnosis: Pain in thoracic spine  Chronic left-sided low back pain with sciatica, sciatica laterality unspecified  Abnormal posture  Muscle weakness (generalized)  Muscle spasm of back     Problem List Patient Active Problem List   Diagnosis Date Noted  . Shortness of breath 10/09/2016   Garen Lah, PT Certified Exercise Expert for the Aging Adult  01/08/17 5:42 PM Phone: [phone] Fax: [phone]  [facility] [address] [place], [place], [address] Phone: [phone]   Fax:  [phone]  Name: Meagan Richardson MRN: [id number] Date of Birth: [birth year]

## 2017-01-08 NOTE — Patient Instructions (Addendum)
   Step-down and one arm reach 1) With your leg that appears longer when you lay on your back, step onto small box or step. 2) Lower your opposite leg down to the floor as you bend into knee 3) As you descend , raise the arm on the same side as the lowered leg up as high as possible, For example, if the left foot is lowering to the floor, raise the left arm. 4) Perform 2 sets of 10 reps on this side only , Do NOT perform the exercise on the opposite side  Garen LahLawrie Kelsha Older, PT Certified Exercise Expert for the Aging Adult  01/08/17 3:10 PM Phone: [phone] Fax: [phone]

## 2017-01-15 ENCOUNTER — Encounter: Payer: Self-pay | Admitting: Physical Therapy

## 2017-01-15 ENCOUNTER — Ambulatory Visit: Payer: Medicaid Other | Admitting: Physical Therapy

## 2017-01-15 DIAGNOSIS — M6281 Muscle weakness (generalized): Secondary | ICD-10-CM

## 2017-01-15 DIAGNOSIS — R293 Abnormal posture: Secondary | ICD-10-CM

## 2017-01-15 DIAGNOSIS — G8929 Other chronic pain: Secondary | ICD-10-CM

## 2017-01-15 DIAGNOSIS — M62830 Muscle spasm of back: Secondary | ICD-10-CM

## 2017-01-15 DIAGNOSIS — M546 Pain in thoracic spine: Secondary | ICD-10-CM

## 2017-01-15 DIAGNOSIS — M5440 Lumbago with sciatica, unspecified side: Secondary | ICD-10-CM

## 2017-01-15 NOTE — Therapy (Signed)
[place], [place], [address] Phone: [phone]   Fax:  [phone]  Physical Therapy Treatment  Patient Details  Name: Meagan Richardson MRN: [id number] Date of Birth: 08-08-1996 Referring Provider: Newman Pies MD  Encounter Date: 01/15/2017      PT End of Session - 01/15/17 0400    Visit Number 4   Number of Visits 16   Date for PT Re-Evaluation 02/12/17   Authorization Type Medicaid   Authorization - Visit Number 4   Authorization - Number of Visits 16   PT Start Time 1500   PT Stop Time 1545   PT Time Calculation (min) 45 min   Activity Tolerance Patient tolerated treatment well   Behavior During Therapy [facility] for tasks assessed/performed      Past Medical History:  Diagnosis Date  . Scoliosis     History reviewed. No pertinent surgical history.  There were no vitals filed for this visit.      Subjective Assessment - 01/15/17 1524    Subjective I have a 5/10 on my low back and my left mid back shoulder (mid scapular pointing to it.    Patient is accompained by: Family member   Limitations Sitting   How long can you sit comfortably? 5 minutes but if exercises help but i return to pain.   How long can you stand comfortably? 15 -30 minutes but then my legs go numb on my right leg   How long can you walk comfortably? 15- 30 minutes   Currently in Pain? Yes   Pain Score 5    Pain Location Back   Pain Orientation Right;Lower;Mid  left thoracic as well   Pain Descriptors / Indicators Aching;Tightness   Pain Type Chronic pain   Pain Onset More than a month ago   Pain Frequency Intermittent                         [facility] Adult PT Treatment/Exercise - 01/15/17 1733      Lumbar Exercises: Standing   Other Standing Lumbar Exercises schroth green tband pull down 20 x with VC tc for adjusting for spine length and scapular placement, split stance with Arm reach 15 x with  left side forward ( longer leg) x 15   Other Standing Lumbar Exercises SL lateral trunk flexion on physioball 15 x on left side, , step down and one arm reach x 15 x 3 sets, with VC for trunk elongation adn scapular placement and 'breathing into concavity.      Lumbar Exercises: Prone   Single Arm Raises Limitations physio ball, prone bil arm extension and bil rows with lower trap activiation   Other Prone Lumbar Exercises downward dog to full arm plank  x 20   pt had increased lumbar pain after ex. so ex was DC                PT Education - 01/15/17 1522    Education provided Yes   Education Details updated scoliosis program exercises given and performed in clinic.     Person(s) Educated Patient   Methods Explanation;Demonstration;Tactile cues;Verbal cues;Handout   Comprehension Verbalized understanding;Returned demonstration          PT Short Term Goals - 01/15/17 1729      PT SHORT TERM GOAL #1   Title "Independent with initial HEP   Baseline Independent with basic stretches   Time 4   Period  Weeks   Status Achieved     PT SHORT TERM GOAL #2   Title "Report pain decrease from 7/10 to  4 /10. at rest when sitting for more than 30 minutes   Baseline Pt cannot sit more than 5 minutes without 5-7/ pain and needing to shift, pt at 5/10 today in low back and no pain in and some 5/10 pain in left thoracic   Time 4   Period Weeks   Status On-going     PT SHORT TERM GOAL #3   Title Pt will be introduced to Pilates for strengthening and lengthening of scoliotic curve   Baseline No knowledge of strengthening and lengthening program   Time 4   Period Weeks   Status Unable to assess           PT Long Term Goals - 01/15/17 1731      PT LONG TERM GOAL #1   Title "Demonstrate and verbalize techniques to reduce the risk of re-injury including: lifting, posture, body mechanics. 02-12-17   Baseline Pt aware of good posture and awareness.  Needs reinforcement , She is  diligent to do all that she is taught   Time 8   Period Weeks   Status On-going     PT LONG TERM GOAL #2   Title "Pt will be independent with advanced HEP.    Baseline Pt given progressive HEP   Time 8   Period Weeks   Status On-going     PT LONG TERM GOAL #3   Title "Demonstrate understanding of proper sitting posture and be more conscious of position and posture throughout the day.    Baseline PT utilizing proper sitting and standing posture  but stands with hyper extension/increased lordosis but is aware and able to shift   Time 8   Period Weeks   Status Partially Met     PT LONG TERM GOAL #4   Title Pt will be able to manage pain using pain management strategies to prepare sitting and driving for greater than 1 hour   Baseline Pt figuring out best way to manage pain needs reinforcement   Time 8   Period Weeks   Status On-going     PT LONG TERM GOAL #5   Title Pt will tolerate standing and walking for 2 hours in order to return to work environment   Baseline Pt can only stand for 15 to 30 minutes now with 3/10 to 5/10 that is better with exercises but quickly returns   Time 8   Period Weeks   Status On-going     PT LONG TERM GOAL #6   Title "FOTO will improve from 26%limitation   to  20%   indicating improved functional mobility   Baseline EVAL 26% limitation   Time 8   Period Weeks   Status Unable to assess     PT LONG TERM GOAL #7   Title Pt will be familiar with community wellness for continuing strength post PT Rehab for scoliosis   Baseline advancing HEP    Time 8   Period Weeks   Status On-going               Plan - 01/15/17 1737    Clinical Impression Statement Miss Eversley returns with 5/10 back pain and tingling down leg.  Improved with all exercise except downward dog so that exercises was discontinued. Added to HEP more challenging SChroth exercisees.  Pt with decreased pain after exercises at end  of session but pt reports that it always returns.   PT declined trigger point dry needling and concentrated on execicses.   pt is more award of posture and position of rotation.  LEft leg apparent longer and she is compressed on left side , tighter quadratus lumborum.   and rotatin of spine   Rehab Potential Good   PT Frequency 2x / week   PT Duration 8 weeks   PT Treatment/Interventions Taping;Dry needling;Manual techniques;Passive range of motion;Patient/family education;Neuromuscular re-education;Therapeutic exercise;Therapeutic activities;Electrical Stimulation;Moist Heat;Cryotherapy;Iontophoresis 10m/ml Dexamethasone;Ultrasound   PT Next Visit Plan  progress exercises for strengthening, teach Balance breathing exercise with balloon next visit and do core strength using physioball.  See if pain is better using physioball activities. Mother is going to purchase for daughter to use at home   PT Home Exercise Plan childs pose and right side plank with breathing in to concavitiy see Exercises given, downward dog to arm extension plank See Schroth exercises   Consulted and Agree with Plan of Care Patient;Family member/caregiver      Patient will benefit from skilled therapeutic intervention in order to improve the following deficits and impairments:  Pain, Improper body mechanics, Postural dysfunction, Increased muscle spasms, Hypermobility, Decreased strength, Decreased range of motion  Visit Diagnosis: Pain in thoracic spine  Chronic left-sided low back pain with sciatica, sciatica laterality unspecified  Abnormal posture  Muscle weakness (generalized)  Muscle spasm of back     Problem List Patient Active Problem List   Diagnosis Date Noted  . Shortness of breath 10/09/2016   LVoncille Lo PT Certified Exercise Expert for the Aging Adult  01/15/17 5:43 PM Phone: [phone]: 3[facility] [address]Phone: [phone]  Fax:   [phone] Name: RALMIRA PHETTEPLACEMRN: [id number] of Birth: 903/24/1997

## 2017-01-15 NOTE — Patient Instructions (Addendum)
       Meagan Richardson, PT Certified Exercise Expert for the Aging Adult  01/15/17 3:22 PM Phone: [phone] Fax: [phone]

## 2017-01-22 ENCOUNTER — Ambulatory Visit: Payer: Medicaid Other | Admitting: Physical Therapy

## 2017-01-22 ENCOUNTER — Encounter: Payer: Self-pay | Admitting: Physical Therapy

## 2017-01-22 DIAGNOSIS — G8929 Other chronic pain: Secondary | ICD-10-CM

## 2017-01-22 DIAGNOSIS — M5440 Lumbago with sciatica, unspecified side: Secondary | ICD-10-CM

## 2017-01-22 DIAGNOSIS — M546 Pain in thoracic spine: Secondary | ICD-10-CM

## 2017-01-22 DIAGNOSIS — M62830 Muscle spasm of back: Secondary | ICD-10-CM

## 2017-01-22 DIAGNOSIS — R293 Abnormal posture: Secondary | ICD-10-CM

## 2017-01-22 DIAGNOSIS — M6281 Muscle weakness (generalized): Secondary | ICD-10-CM

## 2017-01-22 NOTE — Therapy (Signed)
[place], [place], [address] Phone: [phone]   Fax:  [phone]  Physical Therapy Treatment  Patient Details  Name: Meagan Richardson MRN: [id number] Date of Birth: September 04, 1996 Referring Provider: Newman Pies MD  Encounter Date: 01/22/2017      PT End of Session - 01/22/17 1808    Visit Number 5   Number of Visits 16   Date for PT Re-Evaluation 02/12/17   PT Start Time 6333   PT Stop Time 1550   PT Time Calculation (min) 44 min   Activity Tolerance Patient tolerated treatment well   Behavior During Therapy [facility] for tasks assessed/performed      Past Medical History:  Diagnosis Date  . Scoliosis     History reviewed. No pertinent surgical history.  There were no vitals filed for this visit.      Subjective Assessment - 01/22/17 1512    Subjective Has been doing the exercises 2X a day.  TIngling 3 x a week noted in leg.  (Occurs at random times)   Patient is accompained by: Family member  Mother   Currently in Pain? Yes   Pain Score 4    Pain Location Back   Pain Orientation Right;Lower   Pain Descriptors / Indicators Aching;Tingling;Tightness   Pain Type Chronic pain   Pain Frequency Constant   Aggravating Factors  morning pain, picking up , boxes, bending over,  sitting (doses not have to be along time0   Pain Relieving Factors exercxise   Effect of Pain on Daily Activities lifting limits ,  not working lifting boxes due to pain.   Multiple Pain Sites No                         [facility] Adult PT Treatment/Exercise - 01/22/17 0001      Self-Care   Self-Care ADL's;Lifting   Lifting demo for patient, golfer's lift, bolster lift.  Some AFDL's demo.     Lumbar Exercises: Stretches   Quadruped Mid Back Stretch --  2 reps 30 seconds to right X 1     Lumbar Exercises: Standing   Other Standing Lumbar Exercises green tband pull down 20 x with VC tc for adjusting for  spine length and scapular placement, split stance with Arm reach 15 x with left side forward ( longer leg) x 15   Other Standing Lumbar Exercises 6 inch step up with right arm reach and hold 10 X     Lumbar Exercises: Supine   Bent Knee Raise Limitations Scissor, pilates style   Bridge 10 reps   Bridge Limitations with red ball     Lumbar Exercises: Sidelying   Other Sidelying Lumbar Exercises right side plank with breathing into left thoracic concavity x 10 with 10 second hold  spine aligned     Lumbar Exercises: Prone   Other Prone Lumbar Exercises Quadriped right arm only 10 X,  Left lef only 10 x     Manual Therapy   Kinesiotex Inhibit Muscle;Facilitate Muscle     Kinesiotix   Inhibit Muscle  back  top left with fascial componet   Facilitate Muscle  Back  with fascial componet  Bottom right ,                PT Education - 01/22/17 1807    Education provided Yes   Education Details ADL/ lifting   Person(s) Educated Patient;Parent(s)   Methods Explanation;Demonstration;Verbal cues;Handout  Comprehension Verbalized understanding;Need further instruction          PT Short Term Goals - 01/22/17 1810      PT SHORT TERM GOAL #1   Title "Independent with initial HEP   Time 4   Period Weeks   Status Achieved     PT SHORT TERM GOAL #2   Title "Report pain decrease from 7/10 to  4 /10. at rest when sitting for more than 30 minutes   Baseline Sitting causes more than 4/10 pain.     Time 4   Period Weeks   Status On-going     PT SHORT TERM GOAL #3   Title Pt will be introduced to Pilates for strengthening and lengthening of scoliotic curve   Baseline beginning pilates scissors exercise   Time 4   Period Weeks   Status On-going           PT Long Term Goals - 01/15/17 1731      PT LONG TERM GOAL #1   Title "Demonstrate and verbalize techniques to reduce the risk of re-injury including: lifting, posture, body mechanics. 02-12-17   Baseline Pt aware of  good posture and awareness.  Needs reinforcement , She is diligent to do all that she is taught   Time 8   Period Weeks   Status On-going     PT LONG TERM GOAL #2   Title "Pt will be independent with advanced HEP.    Baseline Pt given progressive HEP   Time 8   Period Weeks   Status On-going     PT LONG TERM GOAL #3   Title "Demonstrate understanding of proper sitting posture and be more conscious of position and posture throughout the day.    Baseline PT utilizing proper sitting and standing posture  but stands with hyper extension/increased lordosis but is aware and able to shift   Time 8   Period Weeks   Status Partially Met     PT LONG TERM GOAL #4   Title Pt will be able to manage pain using pain management strategies to prepare sitting and driving for greater than 1 hour   Baseline Pt figuring out best way to manage pain needs reinforcement   Time 8   Period Weeks   Status On-going     PT LONG TERM GOAL #5   Title Pt will tolerate standing and walking for 2 hours in order to return to work environment   Baseline Pt can only stand for 15 to 30 minutes now with 3/10 to 5/10 that is better with exercises but quickly returns   Time 8   Period Weeks   Status On-going     PT LONG TERM GOAL #6   Title "FOTO will improve from 26%limitation   to  20%   indicating improved functional mobility   Baseline EVAL 26% limitation   Time 8   Period Weeks   Status Unable to assess     PT LONG TERM GOAL #7   Title Pt will be familiar with community wellness for continuing strength post PT Rehab for scoliosis   Baseline advancing HEP    Time 8   Period Weeks   Status On-going               Plan - 01/22/17 1808    Clinical Impression Statement Exercises for posture/ core continued.  Trial of taping for scoliosis. Less pain with tape   PT Next Visit Plan Assess tape and  retape, educate Mom how to do . progress exercises for strengthening, teach Balance breathing exercise with  balloon next visit and do core strength using physioball.  See if pain is better using physioball activities. Mother is going to purchase for daughter to use at home   PT Home Exercise Plan childs pose and right side plank with breathing in to concavitiy see Exercises given, downward dog to arm extension plank See Schroth exercises   Consulted and Agree with Plan of Care Patient      Patient will benefit from skilled therapeutic intervention in order to improve the following deficits and impairments:  Pain, Improper body mechanics, Postural dysfunction, Increased muscle spasms, Hypermobility, Decreased strength, Decreased range of motion  Visit Diagnosis: Pain in thoracic spine  Chronic left-sided low back pain with sciatica, sciatica laterality unspecified  Abnormal posture  Muscle weakness (generalized)  Muscle spasm of back     Problem List Patient Active Problem List   Diagnosis Date Noted  . Shortness of breath 10/09/2016    Giovan Pinsky. PTA 01/22/2017, 6:12 PM  [place], [place], [address] Phone: [phone]   Fax:  [phone]  Name: Meagan Richardson MRN: [id number] Date of Birth: 05-13-1996

## 2017-01-22 NOTE — Patient Instructions (Signed)

## 2017-01-29 ENCOUNTER — Ambulatory Visit: Payer: Medicaid Other | Admitting: Physical Therapy

## 2017-02-19 ENCOUNTER — Ambulatory Visit: Payer: Medicaid Other | Attending: Neurosurgery | Admitting: Physical Therapy

## 2017-02-19 ENCOUNTER — Encounter: Payer: Self-pay | Admitting: Physical Therapy

## 2017-02-19 DIAGNOSIS — M5440 Lumbago with sciatica, unspecified side: Secondary | ICD-10-CM | POA: Insufficient documentation

## 2017-02-19 DIAGNOSIS — G8929 Other chronic pain: Secondary | ICD-10-CM | POA: Insufficient documentation

## 2017-02-19 DIAGNOSIS — R293 Abnormal posture: Secondary | ICD-10-CM | POA: Insufficient documentation

## 2017-02-19 DIAGNOSIS — M546 Pain in thoracic spine: Secondary | ICD-10-CM

## 2017-02-19 DIAGNOSIS — M62830 Muscle spasm of back: Secondary | ICD-10-CM | POA: Insufficient documentation

## 2017-02-19 DIAGNOSIS — M6281 Muscle weakness (generalized): Secondary | ICD-10-CM | POA: Insufficient documentation

## 2017-02-19 NOTE — Therapy (Signed)
[place], [place], [address] Phone: [phone]   Fax:  [phone]  Physical Therapy Treatment  Patient Details  Name: Meagan Richardson MRN: [id number] Date of Birth: [birth year] Referring Provider: Newman Pies MD  Encounter Date: 02/19/2017      PT End of Session - 02/19/17 1609    Visit Number 6   Number of Visits 16   Date for PT Re-Evaluation 02/12/17   Authorization Type Medicaid   Authorization - Visit Number 5   Authorization - Number of Visits 16   PT Start Time 1500   PT Stop Time 1547   PT Time Calculation (min) 47 min   Activity Tolerance Patient tolerated treatment well   Behavior During Therapy [facility] for tasks assessed/performed      Past Medical History:  Diagnosis Date  . Scoliosis     History reviewed. No pertinent surgical history.  There were no vitals filed for this visit.      Subjective Assessment - 02/19/17 1502    Subjective Been doing exercises but not all of them.  I didnt like the tape.  so I really dont want to use it. I really dont want surgery but I had tingling in my right leg for 3 minutes last week.     Patient is accompained by: Family member  mom   Limitations Sitting   How long can you sit comfortably? 5 minutes but if exercises help but i return to pain.   How long can you stand comfortably? 15 -30 minutes but then my legs go numb on my right leg   How long can you walk comfortably? 15- 30 minutes   Diagnostic tests x rays < MRI   Pain Score 5    Pain Location Back   Pain Orientation Right;Lower   Pain Descriptors / Indicators Aching;Tingling;Tightness   Pain Type Chronic pain   Pain Onset More than a month ago   Pain Frequency Constant   Aggravating Factors  morning pain , picking up  boxes                         [facility] Adult PT Treatment/Exercise - 02/19/17 1524      Lumbar Exercises: Stretches   Quadruped Mid Back Stretch  3 reps;30 seconds   Quadruped Mid Back Stretch Limitations no pain     Lumbar Exercises: Standing   Other Standing Lumbar Exercises schroth green tband pull down 20 x with VC tc for adjusting for spine length and scapular placement, split stance with Arm reach 15 x with left side forward ( longer leg) x 15   Other Standing Lumbar Exercises SL lateral trunk flexion on physioball 15 x on left side, , step down and one arm reach x 15 x 3 sets, with VC for trunk elongation adn scapular placement and 'breathing into concavity.      Lumbar Exercises: Supine   Bent Knee Raise Limitations Scissor, pilates style   Bridge 15 reps   Bridge Limitations with red ball   Other Supine Lumbar Exercises shoulder bridge, 30 sec x 3,    Other Supine Lumbar Exercises single limb bridge x 15 bil     Lumbar Exercises: Sidelying   Other Sidelying Lumbar Exercises right side plank with breathing into left thoracic concavity x 10 with 10 second hold  spine aligned     Lumbar Exercises: Prone   Single Arm Raises Limitations physio ball, prone  bil arm extension and bil rows with lower trap activiation     Moist Heat Therapy   Number Minutes Moist Heat 10 Minutes   Moist Heat Location Lumbar Spine  thoracic T 5 to T 7     Manual Therapy   Manual therapy comments MET for SI joint L more than Right    Soft tissue mobilization bil lumbosacral area    Kinesiotex Inhibit Muscle  over sacrum 100% compression strap          Trigger Point Dry Needling - 02/19/17 1533    Consent Given? Yes   Education Handout Provided No   Muscles Treated Upper Body Longissimus;Gluteus maximus  lumbo sacral bil over PSIS.    Muscles Treated Lower Body Gluteus maximus;Gluteus minimus   Longissimus Response Twitch response elicited;Palpable increased muscle length  T- 5 to T 7 on right only   Gluteus Maximus Response Twitch response elicited;Palpable increased muscle length  bil   Gluteus Minimus Response Twitch response  elicited;Palpable increased muscle length  bil              PT Education - 02/19/17 1608    Education provided Yes   Education Details single limb bridge and shoulder bridge review of all exercises.   Person(s) Educated Patient;Parent(s)   Methods Explanation;Tactile cues;Verbal cues;Handout;Demonstration   Comprehension Verbalized understanding;Returned demonstration          PT Short Term Goals - 02/19/17 1625      PT SHORT TERM GOAL #2   Title "Report pain decrease from 7/10 to  4 /10. at rest when sitting for more than 30 minutes   Baseline 5/10 today   Time 4   Period Weeks   Status On-going     PT SHORT TERM GOAL #3   Title Pt will be introduced to Pilates for strengthening and lengthening of scoliotic curve   Baseline beginning pilates scissors exercise   Time 4   Period Weeks   Status On-going           PT Long Term Goals - 02/19/17 1625      PT LONG TERM GOAL #1   Title "Demonstrate and verbalize techniques to reduce the risk of re-injury including: lifting, posture, body mechanics. 02-12-17   Baseline Pt aware of good posture and awareness.    Time 8   Period Weeks   Status Achieved     PT LONG TERM GOAL #2   Title "Pt will be independent with advanced HEP.    Baseline Pt given progressive HEP   Time 8   Period Weeks   Status On-going     PT LONG TERM GOAL #3   Title "Demonstrate understanding of proper sitting posture and be more conscious of position and posture throughout the day.    Baseline PT utilizing proper sitting and standing posture  but stands with hyper extension/increased lordosis but is aware and able to shift   Time 8   Period Weeks   Status Achieved     PT LONG TERM GOAL #4   Title Pt will be able to manage pain using pain management strategies to prepare sitting and driving for greater than 1 hour   Baseline Exercises make pt feel better but does not last.     Time 8   Period Weeks   Status On-going     PT LONG TERM  GOAL #5   Title Pt will tolerate standing and walking for 2 hours in order to return to  work environment   Baseline Pt can only stand for 30 minutes now with  5/10 that is better with exercises but quickly returns tingling   Time 8   Period Weeks   Status On-going     PT LONG TERM GOAL #6   Title "FOTO will improve from 26%limitation   to  20%   indicating improved functional mobility   Baseline EVAL 26% limitation   Time 8   Period Weeks   Status Unable to assess     PT LONG TERM GOAL #7   Title Pt will be familiar with community wellness for continuing strength post PT Rehab for scoliosis   Baseline given resources for scoliosis and wrote down for mother   Time 8   Period Weeks   Status Partially Met               Plan - 02/19/17 1609    Clinical Impression Statement Pt did not like the KT tape for inhibition of curves.  Pt consented to trigger point dry needling for pain in lumbosacral area, over PSIS. and recieved MET for SI on left.  Pt reported feeling better after treatment.  Pt did complain before RX about having constant tingling down right leg after 30 minutes of standing or walking.  Pt is mostly compliant with exercises and would like to begin swimming which PT advocated.  Pt  and mom will make appt with Dr. Arnoldo Morale to further evaluate tingling symptoms.  Pt has 2 more visits to try Pilates.  and trigger poitn dry needling as necessary   Rehab Potential Good   PT Frequency 2x / week   PT Duration 8 weeks   PT Treatment/Interventions Taping;Dry needling;Manual techniques;Passive range of motion;Patient/family education;Neuromuscular re-education;Therapeutic exercise;Therapeutic activities;Electrical Stimulation;Moist Heat;Cryotherapy;Iontophoresis 6m/ml Dexamethasone;Ultrasound   PT Next Visit Plan  progress exercises for strengthening, teach Balance breathing exercise with balloon next visit and do core strength using physioball.  See if pain is better using  physioball activities. Mother is going to purchase for daughter to use at home. Pilates next 2 visits and given yoga for scoliosis book   PT Home Exercise Plan childs pose and right side plank with breathing in to concavitiy see Exercises given, downward dog to arm extension plank See Schroth exercises shoulder bridge and single limb bridge   Consulted and Agree with Plan of Care Patient      Patient will benefit from skilled therapeutic intervention in order to improve the following deficits and impairments:  Pain, Improper body mechanics, Postural dysfunction, Increased muscle spasms, Hypermobility, Decreased strength, Decreased range of motion  Visit Diagnosis: Pain in thoracic spine  Chronic left-sided low back pain with sciatica, sciatica laterality unspecified  Abnormal posture  Muscle weakness (generalized)  Muscle spasm of back     Problem List Patient Active Problem List   Diagnosis Date Noted  . Shortness of breath 10/09/2016  LVoncille Lo PT Certified Exercise Expert for the Aging Adult  02/19/17 4:29 PM Phone: [phone]: 3[facility]GR[address] [place] [address]Phone: [phone]  Fax:  [phone] Name: Meagan REIFMRN: [id number] of Birth: [birth year]

## 2017-02-19 NOTE — Patient Instructions (Signed)
Pt given handouts for shoulder bridge x 30 sec x 3 Handout with single limb bridge 15 x 2 From cabinet in gym  Meagan Richardson, PT Certified Exercise Expert for the Aging Adult  02/19/17 4:08 PM Phone: [phone] Fax: [phone]

## 2017-02-26 ENCOUNTER — Ambulatory Visit: Payer: Medicaid Other | Admitting: Physical Therapy

## 2017-02-26 ENCOUNTER — Encounter: Payer: Self-pay | Admitting: Physical Therapy

## 2017-02-26 DIAGNOSIS — M62830 Muscle spasm of back: Secondary | ICD-10-CM

## 2017-02-26 DIAGNOSIS — M5440 Lumbago with sciatica, unspecified side: Secondary | ICD-10-CM

## 2017-02-26 DIAGNOSIS — M6281 Muscle weakness (generalized): Secondary | ICD-10-CM

## 2017-02-26 DIAGNOSIS — R293 Abnormal posture: Secondary | ICD-10-CM

## 2017-02-26 DIAGNOSIS — G8929 Other chronic pain: Secondary | ICD-10-CM

## 2017-02-26 DIAGNOSIS — M546 Pain in thoracic spine: Secondary | ICD-10-CM

## 2017-02-26 NOTE — Therapy (Signed)
[place], [place], [address] Phone: [phone]   Fax:  [phone]  Physical Therapy Treatment  Patient Details  Name: Meagan Richardson MRN: [id number] Date of Birth: [birth year] Referring Provider: Newman Pies MD  Encounter Date: 02/26/2017      PT End of Session - 02/26/17 1445    Visit Number 7   Number of Visits 16   Date for PT Re-Evaluation 02/12/17   Authorization Type Medicaid   PT Start Time 3567   PT Stop Time 1630   PT Time Calculation (min) 45 min   Activity Tolerance Patient tolerated treatment well   Behavior During Therapy [facility] for tasks assessed/performed      Past Medical History:  Diagnosis Date  . Scoliosis     History reviewed. No pertinent surgical history.  There were no vitals filed for this visit.      Subjective Assessment - 02/26/17 1504    Subjective I had a really sharp pain yesterday making up my bed, I was doing my exericises before bed and then I woke Up and did my exercises and it was still hurting.  I dont have as much pain after I do exercises   Limitations Sitting   How long can you sit comfortably? 10 minutes but if exercises help but i return to pain.   How long can you stand comfortably? 15 -30 minutes but then my legs go numb on my right leg   How long can you walk comfortably? 15- 30 minutes   Diagnostic tests x rays < MRI   Currently in Pain? Yes   Pain Score 4   but when it is hurting sharp pain 10/10   Pain Location Back   Pain Orientation Right;Lower   Pain Descriptors / Indicators Aching;Tightness;Tingling   Pain Type Chronic pain   Pain Onset More than a month ago   Pain Frequency Intermittent                         [facility] Adult PT Treatment/Exercise - 02/26/17 1539      Self-Care   Self-Care Other Self-Care Comments   Other Self-Care Comments  Pt given scoliosis yoga book and went througth several exercises for HEP      Lumbar Exercises: Stretches   Quadruped Mid Back Stretch 3 reps;30 seconds   Quadruped Mid Back Stretch Limitations no pain     Lumbar Exercises: Standing   Other Standing Lumbar Exercises SL lateral trunk flexion on physioball 15 x on left side, , step down and one arm reach x 15 x 3 sets, with VC for trunk elongation adn scapular placement and 'breathing into concavity.      Lumbar Exercises: Supine   Bent Knee Raise Limitations Scissor, pilates style   Bridge 15 reps   Bridge Limitations with red ball   Large Ball Abdominal Isometric 10 reps;3 seconds   Other Supine Lumbar Exercises supine on bosu ball for elongation and thoracic ext for 80mnutes     Lumbar Exercises: Sidelying   Other Sidelying Lumbar Exercises right side plank with breathing into left thoracic concavity x 10 with 10 second hold  spine aligned     Lumbar Exercises: Prone   Single Arm Raises Limitations physio ball, prone bil arm extension and bil rows with lower trap activiation   Other Prone Lumbar Exercises Quadriped right arm only 10 X,  Left lef only 10 x  Lumbar Exercises: Quadruped   [place]/Old Horse 15 reps   Opposite Arm/Leg Raise 20 reps;3 seconds   Other Quadruped Lumbar Exercises downward dog and strap at pelvis for elongated back distraction     Shoulder Exercises: Standing   Other Standing Exercises resisted rotatin to right with arms extended x 30 breathing into right concavity          ilates Reformer used for LE/core strength, postural strength, Exercises Footwork 2 red 1 green parallel, turnout and  on ball of foot                   Single leg red x 10 full ROM and small pulse mid range.   Squats x 10 UE with 1 blue and 1 red with bil extension and bil abduction.   Use of rolling bar to flex core and extend spine x 10 Use of UE 1 blue and 1 red spring for core and with pull to right and breathing into concavity.         PT Short Term Goals - 02/26/17 1727      PT SHORT  TERM GOAL #1   Title "Independent with initial HEP   Baseline Independent with basic stretches   Time 4   Period Weeks   Status Achieved     PT SHORT TERM GOAL #2   Title "Report pain decrease from 7/10 to  4 /10. at rest when sitting for more than 30 minutes   Baseline 4/10 today   Time 4   Period Weeks   Status Achieved     PT SHORT TERM GOAL #3   Title Pt will be introduced to Pilates for strengthening and lengthening of scoliotic curve   Baseline pt likes Pilates but needs program to do at home, yoga more available   Time 4   Period Weeks   Status Achieved           PT Long Term Goals - 02/26/17 1728      PT LONG TERM GOAL #1   Title "Demonstrate and verbalize techniques to reduce the risk of re-injury including: lifting, posture, body mechanics. 02-12-17   Baseline Pt aware of good posture and awareness.    Time 8   Period Weeks   Status Achieved     PT LONG TERM GOAL #2   Title "Pt will be independent with advanced HEP.    Baseline Pt given advanced program and Yoga for scoliosis booklet   Time 8   Period Weeks   Status Partially Met     PT LONG TERM GOAL #3   Title "Demonstrate understanding of proper sitting posture and be more conscious of position and posture throughout the day.    Baseline Pt is aware of good posture and angle and knows rotation of body position   Time 8   Period Weeks   Status Achieved     PT LONG TERM GOAL #4   Title Pt will be able to manage pain using pain management strategies to prepare sitting and driving for greater than 1 hour   Baseline Exercises make pt feel better but does not last.  will see Dr. Arnoldo Morale in August and continue HEP    Time 8   Period Weeks   Status On-going     PT LONG TERM GOAL #5   Title Pt will tolerate standing and walking for 2 hours in order to return to work environment   Baseline Pt can only stand for  30 minutes now with  4/10 that is better with exercises but quickly returns tingling   Time 8    Period Weeks   Status On-going     PT LONG TERM GOAL #6   Title "FOTO will improve from 26%limitation   to  20%   indicating improved functional mobility   Baseline EVAL 26% limitation   Time 8   Period Weeks   Status Unable to assess     PT LONG TERM GOAL #7   Title Pt will be familiar with community wellness for continuing strength post PT Rehab for scoliosis   Baseline Pt is aware of [facility] and has HEP   Time 8   Period Weeks   Status Achieved               Plan - 02/26/17 1732    Clinical Impression Statement Pt was given Yoga for Scoliosis booklet. and made aware of local community wellness.  Pt has not made enormous strides with pain.  Now at 4/10 but did have a flare of lup to 8-9/10 pain at times.  Pt to see DR Arnoldo Morale in August to assess need for surgery but knows she will need to continue exericise.  MOst goals have been achieved or partially met.  Will continue 1 time to address questions about HEP and she will continue to do HEP until she talkes with Dr. Arnoldo Morale.    Rehab Potential Good   PT Frequency 2x / week   PT Duration 8 weeks   PT Treatment/Interventions Taping;Dry needling;Manual techniques;Passive range of motion;Patient/family education;Neuromuscular re-education;Therapeutic exercise;Therapeutic activities;Electrical Stimulation;Moist Heat;Cryotherapy;Iontophoresis 63m/ml Dexamethasone;Ultrasound   PT Next Visit Plan Assess goals , FOTO and answer questions for HEP DC after next visit   PT Home Exercise Plan childs pose and right side plank with breathing in to concavitiy see Exercises given, downward dog to arm extension plank See Schroth exercises shoulder bridge and single limb bridge   Consulted and Agree with Plan of Care Patient      Patient will benefit from skilled therapeutic intervention in order to improve the following deficits and impairments:  Pain, Improper body mechanics, Postural dysfunction, Increased muscle spasms,  Hypermobility, Decreased strength, Decreased range of motion  Visit Diagnosis: Pain in thoracic spine  Chronic left-sided low back pain with sciatica, sciatica laterality unspecified  Abnormal posture  Muscle weakness (generalized)  Muscle spasm of back     Problem List Patient Active Problem List   Diagnosis Date Noted  . Shortness of breath 10/09/2016    LVoncille Lo PT Certified Exercise Expert for the Aging Adult  02/26/17 5:36 PM Phone: [phone]: [facility] [address]Phone: [phone]  Fax:  [phone] Name: RSUSANE BEYMRN: [id number] of Birth: 907/27/1997

## 2017-03-05 ENCOUNTER — Encounter: Payer: Self-pay | Admitting: Physical Therapy

## 2017-03-05 ENCOUNTER — Ambulatory Visit: Payer: Medicaid Other | Admitting: Physical Therapy

## 2017-03-05 DIAGNOSIS — M546 Pain in thoracic spine: Secondary | ICD-10-CM

## 2017-03-05 DIAGNOSIS — R293 Abnormal posture: Secondary | ICD-10-CM

## 2017-03-05 DIAGNOSIS — G8929 Other chronic pain: Secondary | ICD-10-CM

## 2017-03-05 DIAGNOSIS — M62830 Muscle spasm of back: Secondary | ICD-10-CM

## 2017-03-05 DIAGNOSIS — M5440 Lumbago with sciatica, unspecified side: Secondary | ICD-10-CM

## 2017-03-05 DIAGNOSIS — M6281 Muscle weakness (generalized): Secondary | ICD-10-CM

## 2017-03-05 NOTE — Patient Instructions (Signed)
Shoulder bridge 3 x  30 second Scissors or knee bent raises 10 x bil Clamshell bil sidelying 10 x   Handouts given out in clinic.  Pt also addressed yoga book exercises and questions as brought up by Ms. Hovis.  Garen LahLawrie Mathan Darroch, PT Certified Exercise Expert for the Aging Adult  03/05/17 5:50 PM Phone: [phone] Fax: [phone]

## 2017-03-05 NOTE — Therapy (Signed)
[place], [place], [address] Phone: [phone]   Fax:  [phone]  Physical Therapy Treatment/Discharge Note  Patient Details  Name: Meagan Richardson MRN: [id number] Date of Birth: [birth year] Referring Provider: Newman Pies MD  Encounter Date: 03/05/2017      PT End of Session - 03/05/17 1536    Visit Number 8   Number of Visits 16   Date for PT Re-Evaluation 02/12/17   Authorization Type Medicaid   Authorization - Visit Number 8   Authorization - Number of Visits 16   PT Start Time 1500   PT Stop Time 1545   PT Time Calculation (min) 45 min   Activity Tolerance Patient tolerated treatment well   Behavior During Therapy [facility] for tasks assessed/performed      Past Medical History:  Diagnosis Date  . Scoliosis     History reviewed. No pertinent surgical history.  There were no vitals filed for this visit.      Subjective Assessment - 03/05/17 1504    Subjective I had really sharp pain in my upper back and my lower back.  I am able to use exercises to relieve pain but the pain always comes back.   Limitations Sitting   How long can you sit comfortably? 10 minutes but if exercises help but i return to pain.   How long can you stand comfortably? 15 -30 minutes but then my legs go numb on my right leg   How long can you walk comfortably? 15- 30 minutes   Diagnostic tests x rays < MRI   Patient Stated Goals I wish I could keep from having pain   Currently in Pain? Yes   Pain Score 3    Pain Location Back   Pain Orientation Right;Lower   Pain Descriptors / Indicators Aching;Tightness;Tingling   Pain Type Chronic pain   Pain Onset More than a month ago   Pain Frequency Intermittent            [facility] PT Assessment - 03/05/17 1800      Observation/Other Assessments   Focus on Therapeutic Outcomes (FOTO)  FOTO intake 58% limtation 42% predicted 24%     Posture/Postural Control   Posture Comments ectomorphic body type      AROM   Overall AROM  Deficits   Lumbar Flexion 80   Lumbar Extension 30   Lumbar - Right Side Bend 30   Lumbar - Left Side Bend 40   Lumbar - Right Rotation 80%   Lumbar - Left Rotation 80%     Strength   Overall Strength Deficits   Overall Strength Comments grossly 4+/5 to 5/5     Palpation   Spinal mobility hypermobility of spine   Palpation comment Pt still with occassional spasm, but can control with stretching and exericses.  Pt still with tingling down right leg to foot.        Pt intially worked on [employer] with roll down bar and yellow spring resistance for elevation and pull down x 10 each utilizing self de rotation and breathing into concavity while working on strengthening and alignment of spine.  Side sitting with right UE on roll down bar with proper breathing on exertion.    Using reformer box with red and blue, flexing forward with bil UE and rolling back utilizing abdominal breathing to engage core.  No pain felt while utilizing.  [place] Adult PT Treatment/Exercise - 03/05/17 1510      Self-Care   Self-Care Other Self-Care Comments   Other Self-Care Comments  discussed community wellness opportunities for scoliosis and strengthening     Lumbar Exercises: Stretches   Quadruped Mid Back Stretch 3 reps;30 seconds   Quadruped Mid Back Stretch Limitations no pain     Lumbar Exercises: Standing   Other Standing Lumbar Exercises SL lateral trunk flexion on physioball 15 x on left side, , step down and one arm reach x 15 x 3 sets, with VC for trunk elongation adn scapular placement and 'breathing into concavity.      Lumbar Exercises: Supine   Bent Knee Raise Limitations Scissor, pilates style   Bridge 15 reps   Bridge Limitations with red ball   Other Supine Lumbar Exercises shoulder bridge, 30 sec x 3,    Other Supine Lumbar Exercises single limb bridge x 15 bil     Lumbar Exercises: Sidelying    Other Sidelying Lumbar Exercises right side plank with breathing into left thoracic concavity x 10 with 10 second hold  spine aligned     Lumbar Exercises: Prone   Other Prone Lumbar Exercises Quadriped right arm only 10 X,  Left lef only 10 x  vc for abdominal bracing                PT Education - 03/05/17 1750    Education Details shoulder bridge, clamshells and bent knee raises. handouts   Person(s) Educated Patient   Methods Explanation;Demonstration;Tactile cues;Verbal cues;Handout   Comprehension Verbalized understanding;Returned demonstration          PT Short Term Goals - 03/05/17 1528      PT SHORT TERM GOAL #1   Title "Independent with initial HEP   Baseline Independent with basic stretches   Time 4   Period Weeks   Status Achieved     PT SHORT TERM GOAL #2   Title "Report pain decrease from 7/10 to  4 /10. at rest when sitting for more than 30 minutes   Baseline at most a 3/10   Time 4   Period Weeks   Status Achieved     PT SHORT TERM GOAL #3   Title Pt will be introduced to Pilates for strengthening and lengthening of scoliotic curve   Baseline pt likes Pilates but needs program to do at home, yoga more available and given book for yoga for scoliosis   Time 4   Period Weeks   Status Achieved           PT Long Term Goals - 03/05/17 1528      PT LONG TERM GOAL #1   Title "Demonstrate and verbalize techniques to reduce the risk of re-injury including: lifting, posture, body mechanics. 02-12-17   Baseline Pt aware of good posture and awareness. Pt is aware of your concavity and rotation and adjusts exercises   Time 8   Period Weeks   Status Achieved     PT LONG TERM GOAL #2   Title "Pt will be independent with advanced HEP.    Baseline Pt given advanced program and Yoga for scoliosis booklet   Time 8   Period Weeks   Status Achieved     PT LONG TERM GOAL #3   Title "Demonstrate understanding of proper sitting posture and be more conscious  of position and posture throughout the day.    Baseline Pt is aware of good posture and angle and knows  rotation of body position   Time 8   Period Weeks   Status Achieved     PT LONG TERM GOAL #4   Title Pt will be able to manage pain using pain management strategies to prepare sitting and driving for greater than 1 hour   Baseline Exercises make pt feel better but does not last.  will see Dr. Arnoldo Morale in August and continue HEP . Pt knows how to adust for pain but pain returns   Time 8   Period Weeks   Status Partially Met     PT LONG TERM GOAL #5   Title Pt will tolerate standing and walking for 2 hours in order to return to work environment   Baseline Pt can only stand for 30 minutes to one hour now with  3/10 that is better with exercises but pain /tingling returns down to right side down to feet   Time 8   Period Weeks   Status Partially Met     PT LONG TERM GOAL #6   Title "FOTO will improve from 26%limitation   to  20%   indicating improved functional mobility   Baseline FOTO today limitation  42% , not indicative of pt ability to manage her pain and scoliosis   Time 8   Period Weeks   Status Not Met     PT LONG TERM GOAL #7   Title Pt will be familiar with community wellness for continuing strength post PT Rehab for scoliosis   Baseline Pt is aware of [facility] and has HEP/ scoliosis book for yoga   Time 8   Period Weeks   Status Achieved               Plan - 03/05/17 1813    Clinical Impression Statement Pt has achieved most goals except for FOTO score which was not met and not indicative of Ms. Braileys ability to manage her pain and scoliosis.  Pt achieved all other goals.  Pt is aware of communitiy resources and is knowledgeable about her spine and curve.  Pt has exercises and manages her pain and has ability to de rotate and choose appropriate exercises for her spine at this time.  Ms. Kosanke is concerned that she continues to have pain and tingling  into her right foot  from low back pain. Pt is able to control acute pain with exercises but pain continues to come back.  Pt is aware that she needs to continue strengthening and that strengthening will be her life goal.  She will be consulting her MD about possible surgery and the advantages and disadvantges of it.  Ms. Basulto has been an exemplary patient and it has been a delight to serve her.     Rehab Potential Good   PT Frequency 2x / week   PT Duration 8 weeks   PT Treatment/Interventions Taping;Dry needling;Manual techniques;Passive range of motion;Patient/family education;Neuromuscular re-education;Therapeutic exercise;Therapeutic activities;Electrical Stimulation;Moist Heat;Cryotherapy;Iontophoresis 36m/ml Dexamethasone;Ultrasound   PT Next Visit Plan DC   PT Home Exercise Plan childs pose and right side plank with breathing in to concavitiy see Exercises given, downward dog to arm extension plank See Schroth exercises shoulder bridge and single limb bridge table top, shoulder bridge, bent knee raise and clams.   Consulted and Agree with Plan of Care Patient      Patient will benefit from skilled therapeutic intervention in order to improve the following deficits and impairments:  Pain, Improper body mechanics, Postural dysfunction, Increased muscle  spasms, Hypermobility, Decreased strength, Decreased range of motion  Visit Diagnosis: Pain in thoracic spine  Chronic left-sided low back pain with sciatica, sciatica laterality unspecified  Abnormal posture  Muscle weakness (generalized)  Muscle spasm of back       G-Codes - 04-04-17 1754    Functional Assessment Tool Used (Outpatient Only) FOTO   Functional Limitation Changing and maintaining body position   Changing and Maintaining Body Position Goal Status (T6435) At least 1 percent but less than 20 percent impaired, limited or restricted  20%   Changing and Maintaining Body Position Discharge Status (T9122) At least 40  percent but less than 60 percent impaired, limited or restricted  42%      Problem List Patient Active Problem List   Diagnosis Date Noted  . Shortness of breath 10/09/2016   Voncille Lo, PT Certified Exercise Expert for the Aging Adult  04-04-17 6:18 PM Phone: [phone] Fax: [facility] [address] [place], [place], [address] Phone: [phone]   Fax:  [phone]   PHYSICAL THERAPY DISCHARGE SUMMARY  Visits from Start of Care: 8  Current functional level related to goals / functional outcomes: See above   Remaining deficits: Pt continues to have pain, tingling into right foot from back pain as well as upper back discomfort. Pt is better with exercises but relief is not long term   Education / Equipment: HEP and community resources. Trigger point dry needlingl and intial pilates Plan: Patient agrees to discharge.  Patient goals were partially met. Patient is being discharged due to meeting the stated rehab goals.  ?????    and pt is independent with HEP   Voncille Lo, PT Certified Exercise Expert for the Aging Adult  Apr 04, 2017 6:20 PM Phone: [phone] Fax: [phone]    Name: Meagan Richardson MRN: [id number] Date of Birth: 02-25-1996

## 2017-07-07 IMAGING — DX DG CHEST 2V
2 series · 2 of 2 positions shown · non-contrast
Comparison: None.

CLINICAL DATA: Shortness of breath while standing, intermittent
since Wednesday June, 2016. Nonsmoker.

EXAM:
CHEST  2 VIEW

[chest pa]
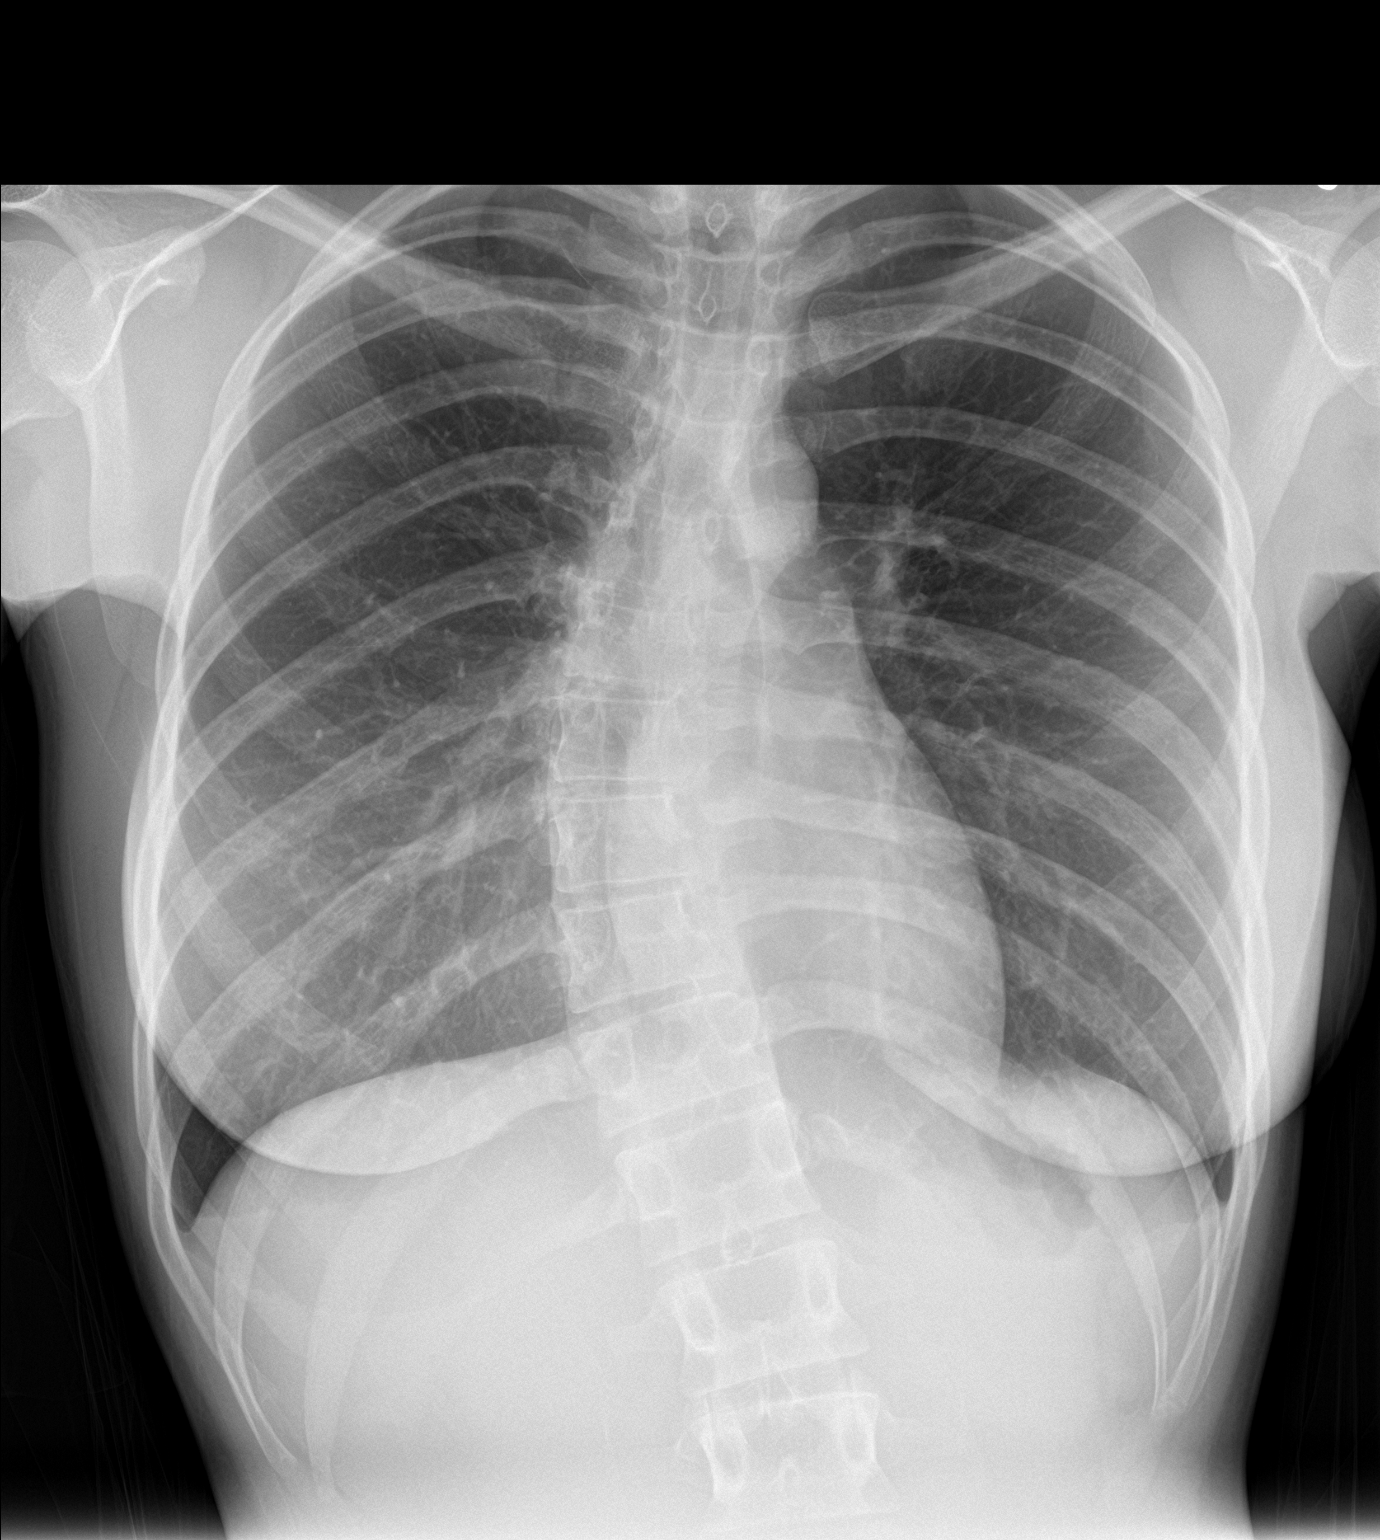

[chest lat]
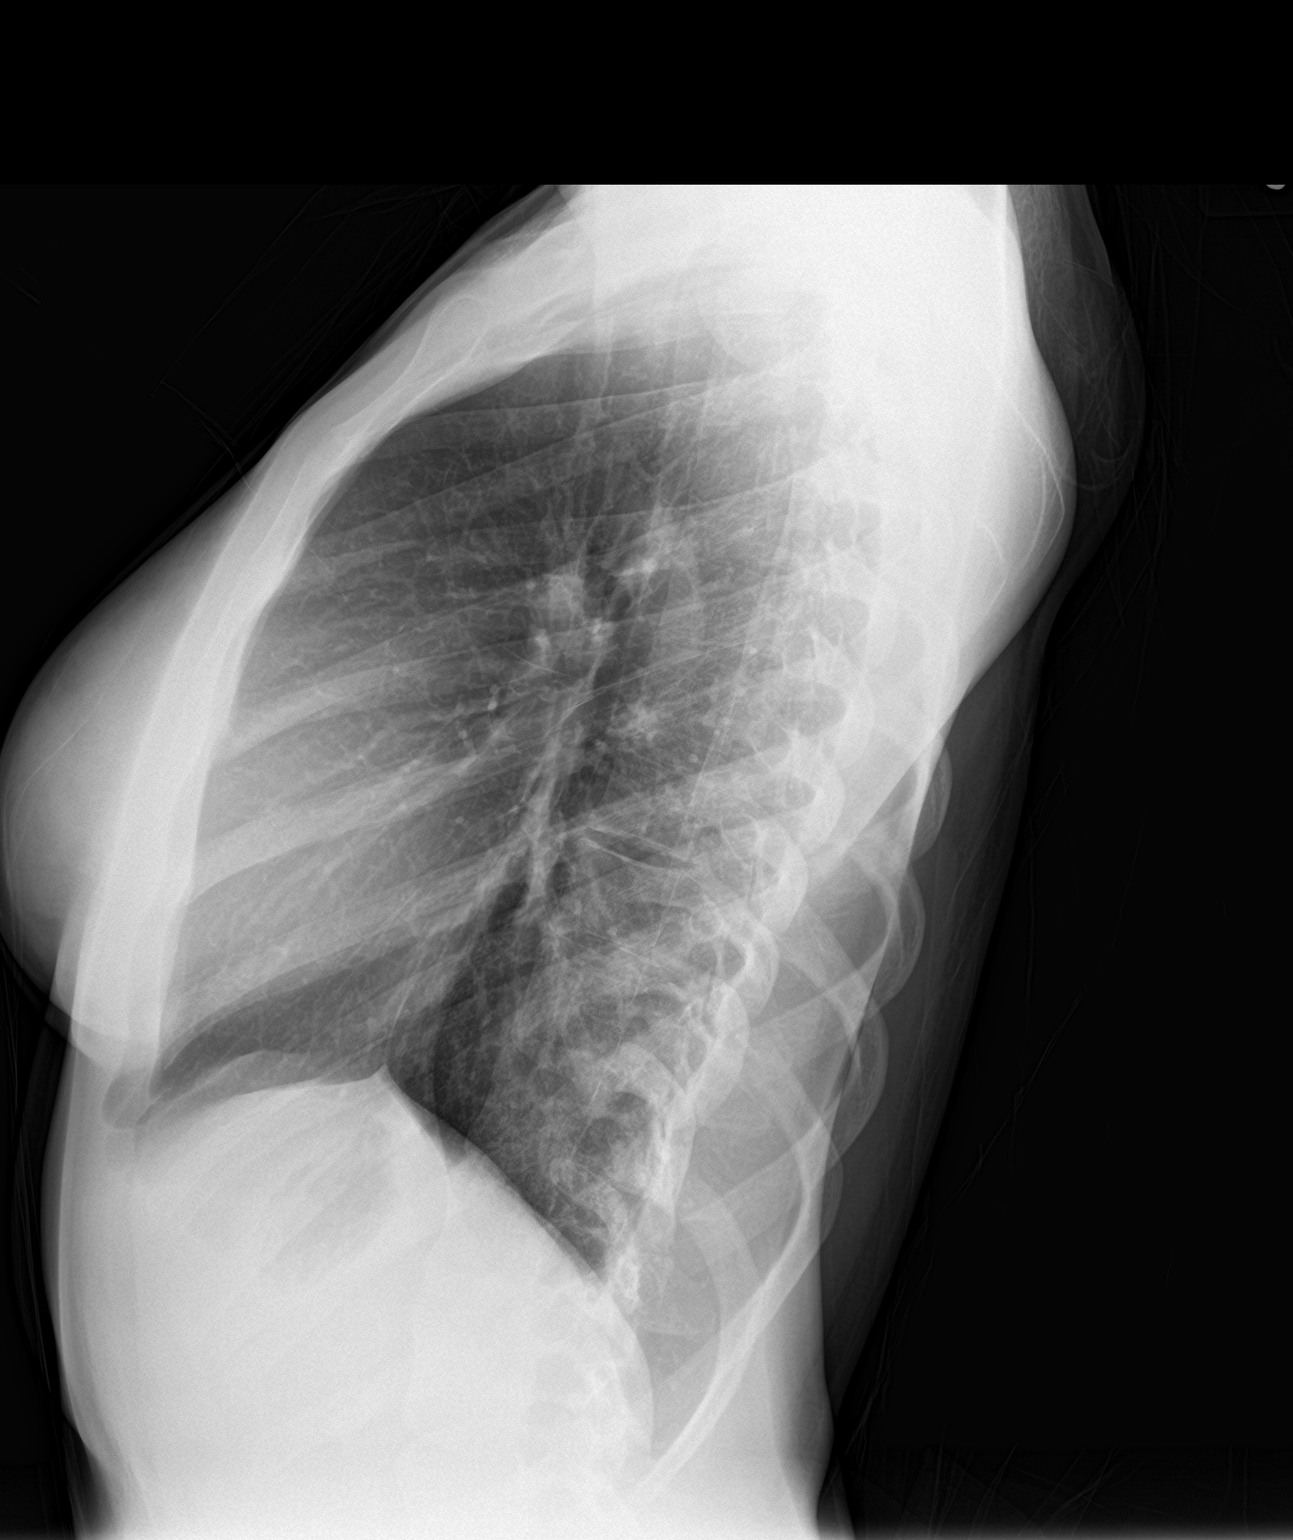

[2 of 2 positions shown; findings below may reference images not displayed]

FINDINGS: Heart size and mediastinal contours are normal. Lungs are clear.
Lung volumes are normal. No pleural effusion or pneumothorax seen.

There is a prominent dextroscoliosis of the thoracolumbar spine. No
acute or suspicious osseous finding.
IMPRESSION: 1. No active cardiopulmonary disease. No evidence of pneumonia or
pulmonary edema. Lung volumes are normal.
2. Prominent scoliosis of the thoracic spine.

## 2021-09-08 NOTE — L&D Delivery Note (Signed)
Delivery Note Meagan Richardson is a 26 y.o. G1P0 at [redacted]w[redacted]d admitted for IOL d/t uncontrolled A1DM.   GBS Status: Negative/-- (10/02 1107) Maximum Maternal Temperature: 98.4  Labor course: Initial SVE: 3.5/50/-2. Augmentation with: AROM and Pitocin. She then progressed to complete.  ROM: 3h 58m with clear fluid  Birth: At 2024 a viable female was delivered via spontaneous vaginal delivery (Presentation: LOA). Nuchal cord present: No, leg cord.  Shoulders and body delivered in usual fashion. Infant placed directly on mom's abdomen for bonding/skin-to-skin, baby dried and stimulated. Cord clamped x 2 after 1 minute and cut by FOB.  Cord blood collected.  The placenta separated spontaneously and delivered via gentle cord traction.  Pitocin infused rapidly IV per protocol.  Fundus firm with massage.  Placenta inspected and appears to be intact with a 3 VC.  Placenta/Cord with the following complications: none .  Cord pH: not done Sponge and instrument count were correct x2.  Intrapartum complications:  None Anesthesia:  epidural Episiotomy: none Lacerations:  2nd degree and Rt labial Suture Repair: 3.0 vicryl for 2nd degree, 4.0vicryl for Rt labial EBL (mL): 113   Infant: APGAR (1 MIN): 9   APGAR (5 MINS): 9   APGAR (10 MINS):    Infant weight: pending  Mom to postpartum.  Baby to Couplet care / Skin to Skin. Placenta to L&D   Plans to Breastfeed Contraception: Nexplanon Circumcision: N/A  Note sent to [facility]: [facility] for pp visit.  [place], [facility] 06/17/2022 8:51 PM

## 2022-01-07 ENCOUNTER — Encounter: Payer: Self-pay | Admitting: Obstetrics and Gynecology

## 2022-01-07 ENCOUNTER — Ambulatory Visit (INDEPENDENT_AMBULATORY_CARE_PROVIDER_SITE_OTHER): Payer: BC Managed Care – PPO | Admitting: Obstetrics and Gynecology

## 2022-01-07 ENCOUNTER — Other Ambulatory Visit (HOSPITAL_COMMUNITY)
Admission: RE | Admit: 2022-01-07 | Discharge: 2022-01-07 | Disposition: A | Discharge status: Home / Self Care | Payer: BC Managed Care – PPO | Source: Ambulatory Visit | Attending: Obstetrics and Gynecology | Admitting: Obstetrics and Gynecology

## 2022-01-07 VITALS — BP 115/76 | HR 68 | Wt 117.5 lb

## 2022-01-07 DIAGNOSIS — Z3401 Encounter for supervision of normal first pregnancy, first trimester: Secondary | ICD-10-CM | POA: Diagnosis not present

## 2022-01-07 DIAGNOSIS — Z36 Encounter for antenatal screening for chromosomal anomalies: Secondary | ICD-10-CM | POA: Diagnosis not present

## 2022-01-07 DIAGNOSIS — M419 Scoliosis, unspecified: Secondary | ICD-10-CM | POA: Insufficient documentation

## 2022-01-07 DIAGNOSIS — Z3400 Encounter for supervision of normal first pregnancy, unspecified trimester: Secondary | ICD-10-CM | POA: Insufficient documentation

## 2022-01-07 DIAGNOSIS — Z3A14 14 weeks gestation of pregnancy: Secondary | ICD-10-CM | POA: Diagnosis not present

## 2022-01-07 MED ORDER — BLOOD PRESSURE KIT DEVI: 1.0000 | 0 refills | Status: DC | PRN

## 2022-01-07 MED ORDER — GOJJI WEIGHT SCALE MISC: 1.0000 | 0 refills | Status: DC | PRN

## 2022-01-07 NOTE — Progress Notes (Signed)
NOB is in thje office, confirmed at [facility]. Reports some upper abdominal pain at times. ?

## 2022-01-07 NOTE — Patient Instructions (Signed)
For colds and allergies  Any anti-histamine including benadryl, allegra, claritin, etc.  Sudafed but not phenylephrine  Mucinex  Robitussin  For Reflux/heartburn  Pepcid Zantac Tums Prilosec Prevacid  For yeast infections  Monistat  For constipation  Colace  For minor aches and pains  Tylenol-do not take more than 4000mg in 24 hours. Therma-care or like heat packs  

## 2022-01-07 NOTE — Progress Notes (Signed)
? ?INITIAL PRENATAL VISIT NOTE ? ?Subjective:  ?Meagan Richardson is a 26 y.o. G1P0 at 37w2dby ultrasound [facility] being seen today for her initial prenatal visit. She has an obstetric history significant for primiparity. She has a medical history significant for scoliosis. ? ?Patient reports no complaints.  Contractions: Not present. Vag. Bleeding: None.   . Denies leaking of fluid.  ? ? ?Past Medical History:  ?Diagnosis Date  ? Scoliosis   ? ? ?History reviewed. No pertinent surgical history. ? ?OB History  ?Gravida Para Term Preterm AB Living  ?1         0  ?SAB IAB Ectopic Multiple Live Births  ?           ?  ?# Outcome Date GA Lbr Len/2nd Weight Sex Delivery Anes PTL Lv  ?1 Current           ? ? ?Social History  ? ?Socioeconomic History  ? Marital status: Single  ?  Spouse name: Not on file  ? Number of children: Not on file  ? Years of education: Not on file  ? Highest education level: Not on file  ?Occupational History  ? Not on file  ?Tobacco Use  ? Smoking status: Never  ? Smokeless tobacco: Never  ?Vaping Use  ? Vaping Use: Never used  ?Substance and Sexual Activity  ? Alcohol use: No  ? Drug use: No  ? Sexual activity: Yes  ?Other Topics Concern  ? Not on file  ?Social History Narrative  ? Not on file  ? ?Social Determinants of Health  ? ?Financial Resource Strain: Not on file  ?Food Insecurity: Not on file  ?Transportation Needs: Not on file  ?Physical Activity: Not on file  ?Stress: Not on file  ?Social Connections: Not on file  ? ? ?History reviewed. No pertinent family history. ? ? ?Current Outpatient Medications:  ?  Misc. Devices (GOJJI WEIGHT SCALE) MISC, 1 Device by Does not apply route as needed., Disp: 1 each, Rfl: 0 ?  Prenatal Vit-Fe Fumarate-FA (MULTIVITAMIN-PRENATAL) 27-0.8 MG TABS tablet, Take 1 tablet by mouth daily at 12 noon., Disp: , Rfl:  ?  Blood Pressure Monitoring (BLOOD PRESSURE KIT) DEVI, 1 kit by Does not apply route as needed. (Patient not taking: Reported  on 01/07/2022), Disp: 1 each, Rfl: 0 ?  ibuprofen (ADVIL,MOTRIN) 600 MG tablet, Take 600 mg by mouth as needed. (Patient not taking: Reported on 01/07/2022), Disp: , Rfl:  ? ?No Known Allergies ? ?Review of Systems: Negative except for what is mentioned in HPI. ? ?Objective:  ? ?Vitals:  ? 01/07/22 1009  ?BP: 115/76  ?Pulse: 68  ?Weight: 53.3 kg  ? ? ?Fetal Status: Fetal Heart Rate (bpm): 149        ? ?Physical Exam: ?BP 115/76   Pulse 68   Wt 53.3 kg   LMP 09/29/2021   BMI 21.49 kg/m?  ?CONSTITUTIONAL: Well-developed, well-nourished female in no acute distress.  ?NEUROLOGIC: Alert and oriented to person, place, and time. Normal reflexes, muscle tone coordination. No cranial nerve deficit noted. ?PSYCHIATRIC: Normal mood and affect. Normal behavior. Normal judgment and thought content. ?SKIN: Skin is warm and dry. No rash noted. Not diaphoretic. No erythema. No pallor. ?HENT:  Normocephalic, atraumatic, External right and left ear normal. Oropharynx is clear and moist ?EYES: Conjunctivae and EOM are normal.  ?NECK: Normal range of motion, supple, no masses ?CARDIOVASCULAR: Normal heart rate noted, regular rhythm ?RESPIRATORY: Effort and breath sounds normal,  no problems with respiration noted ?BREASTS: symmetric, non-tender, no masses palpable, chaperone present ?ABDOMEN: Soft, nontender, nondistended, gravid. ?GU: normal appearing external female genitalia, nulliparous normal appearing cervix, scant white discharge in vagina, no lesions noted, pap taken, chaperone present ?Bimanual: 13 weeks sized uterus, no adnexal tenderness or palpable lesions noted ?MUSCULOSKELETAL: Normal range of motion. ?EXT:  No edema and no tenderness. 2+ distal pulses. ? ? ?Assessment and Plan:  ?Pregnancy: G1P0 at 52w2dby ultrasound ? ?1. Supervision of normal first pregnancy, antepartum ?Continue routine care ? ?- Blood Pressure Monitoring (BLOOD PRESSURE KIT) DEVI; 1 kit by Does not apply route as needed. (Patient not taking: Reported  on 01/07/2022)  Dispense: 1 each; Refill: 0 ?- Misc. Devices (GOJJI WEIGHT SCALE) MISC; 1 Device by Does not apply route as needed.  Dispense: 1 each; Refill: 0 ?- Babyscripts Schedule Optimization ?- Cytology - PAP( [facility]) ?- Cervicovaginal ancillary only( ) ?- CBC/D/Plt+RPR+Rh+ABO+RubIgG... ?- Hepatitis C Antibody ?- Culture, OB Urine ?- Genetic Screening ?- UKoreaMFM OB COMP + 14 WK; Future ? ?2. [redacted] weeks gestation of pregnancy ? ? ? ?Preterm labor symptoms and general obstetric precautions including but not limited to vaginal bleeding, contractions, leaking of fluid and fetal movement were reviewed in detail with the patient. ? ?Please refer to After Visit Summary for other counseling recommendations.  ? ?Return in about 4 weeks (around 02/04/2022) for ROB, in person. ? ?LGriffin Basil?01/07/2022 ?1:25 PM  ?

## 2022-01-08 LAB — CERVICOVAGINAL ANCILLARY ONLY
Bacterial Vaginitis (gardnerella): NEGATIVE
Candida Glabrata: NEGATIVE
Candida Vaginitis: NEGATIVE
Chlamydia: NEGATIVE
Comment: NEGATIVE
Comment: NEGATIVE
Comment: NEGATIVE
Comment: NEGATIVE
Comment: NEGATIVE
Comment: NORMAL
Neisseria Gonorrhea: NEGATIVE
Trichomonas: NEGATIVE

## 2022-01-08 LAB — CBC/D/PLT+RPR+RH+ABO+RUBIGG...
Antibody Screen: NEGATIVE
Basophils Absolute: 0.1 10*3/uL (ref 0.0–0.2)
Basos: 0 %
EOS (ABSOLUTE): 0.3 10*3/uL (ref 0.0–0.4)
Eos: 2 %
HCV Ab: NONREACTIVE
HIV Screen 4th Generation wRfx: NONREACTIVE
Hematocrit: 37.4 % (ref 34.0–46.6)
Hemoglobin: 12.3 g/dL (ref 11.1–15.9)
Hepatitis B Surface Ag: NEGATIVE
Immature Grans (Abs): 0.1 10*3/uL (ref 0.0–0.1)
Immature Granulocytes: 1 %
Lymphocytes Absolute: 1.8 10*3/uL (ref 0.7–3.1)
Lymphs: 16 %
MCH: 26.3 pg — ABNORMAL LOW (ref 26.6–33.0)
MCHC: 32.9 g/dL (ref 31.5–35.7)
MCV: 80 fL (ref 79–97)
Monocytes Absolute: 0.7 10*3/uL (ref 0.1–0.9)
Monocytes: 6 %
Neutrophils Absolute: 8.3 10*3/uL — ABNORMAL HIGH (ref 1.4–7.0)
Neutrophils: 75 %
Platelets: 313 10*3/uL (ref 150–450)
RBC: 4.67 x10E6/uL (ref 3.77–5.28)
RDW: 14.5 % (ref 11.7–15.4)
RPR Ser Ql: NONREACTIVE
Rh Factor: POSITIVE
Rubella Antibodies, IGG: 12.4 index (ref >0.99)
WBC: 11.2 10*3/uL — ABNORMAL HIGH (ref 3.4–10.8)

## 2022-01-08 LAB — HEPATITIS C ANTIBODY: Hep C Virus Ab: NONREACTIVE

## 2022-01-08 LAB — HCV INTERPRETATION

## 2022-01-08 LAB — CYTOLOGY - PAP
Comment: NEGATIVE
Diagnosis: NEGATIVE
High risk HPV: NEGATIVE

## 2022-01-09 LAB — CULTURE, OB URINE

## 2022-01-09 LAB — URINE CULTURE, OB REFLEX: Organism ID, Bacteria: NO GROWTH

## 2022-01-13 ENCOUNTER — Encounter: Payer: Self-pay | Admitting: Obstetrics and Gynecology

## 2022-01-20 ENCOUNTER — Encounter: Payer: Self-pay | Admitting: Obstetrics and Gynecology

## 2022-01-28 ENCOUNTER — Encounter: Payer: Self-pay | Admitting: Obstetrics and Gynecology

## 2022-01-28 DIAGNOSIS — D563 Thalassemia minor: Secondary | ICD-10-CM | POA: Insufficient documentation

## 2022-01-29 ENCOUNTER — Other Ambulatory Visit: Payer: Self-pay

## 2022-01-29 DIAGNOSIS — D563 Thalassemia minor: Secondary | ICD-10-CM

## 2022-02-04 ENCOUNTER — Encounter: Payer: Self-pay | Admitting: Obstetrics

## 2022-02-04 ENCOUNTER — Ambulatory Visit (INDEPENDENT_AMBULATORY_CARE_PROVIDER_SITE_OTHER): Payer: BC Managed Care – PPO | Admitting: Student

## 2022-02-04 VITALS — BP 114/75 | HR 76 | Wt 130.0 lb

## 2022-02-04 DIAGNOSIS — Z3400 Encounter for supervision of normal first pregnancy, unspecified trimester: Secondary | ICD-10-CM | POA: Diagnosis not present

## 2022-02-04 DIAGNOSIS — Z3402 Encounter for supervision of normal first pregnancy, second trimester: Secondary | ICD-10-CM

## 2022-02-04 DIAGNOSIS — Z3A18 18 weeks gestation of pregnancy: Secondary | ICD-10-CM

## 2022-02-04 NOTE — Patient Instructions (Signed)
AREA FAMILY PRACTICE PHYSICIANS  [place] ([id number]) [facility] [address] [place], [place] [address] [phone] Mon-Fri 8:30-12:30, 1:30-5:00 Accepting Medicaid [facility] [address], [place], [place] [address] [phone] Mon-Fri 8:00-5:30 [facility] [address] [place], [place] [address] [phone] Mon, Tue, Thur, Fri 8:30-5:00, Wed 10:00-7:00 (closed 1-2pm) Accepting Medicaid [facility] [address], [place], [place]  [address] Phone - [phone]   Fax - [phone]  [place] ([address]) [facility] [address] [place], [place] [address] [phone] Mon-Fri 8:00-5:00 [facility] ([facility])  [address] [place], [place] [address] [phone] Mon-Fri 8:30-5:30, Sat (Oct.-Mar.) 9:00-1:00 Accepting Medicaid  [place] ([address]) [facility] [address], [place], [place] [address] [phone] Mon-Fri 8:00-5:00  [place] ([address]) [facility] [address], [place], [place] [address] [phone] Mon-Fri 8:00-5:00 [facility] [address], [place], [place] [address] [phone] Mon-Fri 8:00-5:00 [facility] [address] [place], [place] [address] [phone] Mon-Fri 8:00-5:00 [facility] [address] [place] [address] [phone] Mon-Fri 7:30-5:30  [place] ([address] & [address]) [facility] [address] [place], [place] [address] [phone] Mon-Thur 8:00-6:00 Accepting Medicaid [facility] [address] [place], [place] [address] [phone] Mon-Thur 7:30-7:30, Fri 7:30-4:30 Accepting  Medicaid [facility] [address], [place], [place]  [address] [phone]   Fax - [phone]  [place] ([address] & [address]) [facility] [address] [place], [place] [address] [phone] Mon-Fri 7:00-5:00 [facility] [address], [place], [place] [address] [phone] Mon-Fri 8:00-5:00 Accepting Medicaid [facility] [address], [place], [place] [address] [phone] Mon-Fri 8:00-5:00 Accepting Medicaid  [place] ([address]) [facility] [place] [address] [place], [place] [address] [phone] Mon-Fri 8:00-5:00 [facility] [address], [place], [place] [address] [phone] Mon-Fri 8:00-5:00 Accepting Medicaid [facility] ([facility] [address], [place], [place] [address] [phone] Mon-Fri 8:00-5:30, Sat&Sun by appointment (phones open at 8:30) Accepting Medicaid  [place] ([address]) [facility] [address] [place], [place] [address] [phone] Mon-Thur 8:00-7:00, Fri 8:00-5:00, Sat 8:00-12:00, Sun 9:00-12:00 Accepting Medicaid [facility] [place] [address], [place], [place] [address] [phone] Mon-Thur 8:00-5:00 Accepting Medicaid [facility] [address] [place], [place] [address] [phone] Mon-Fri 8:00-5:30, Sat (Oct.-Mar.) 9:00-1:00 Accepting Medicaid  [place] ([address]) [facility] [address], [place], [place] [address] [phone] Mon-Fri 8:00-5:00 Accepting Medicaid   [place] ([address]) [facility] [place] [address], [place], [place] [address] [phone] Mon-Fri 8:00-5:00 [facility] [place] [address], [place], [place] [address] [phone] Mon-Fri 8:00-5:00 [facility] [place] [address] [place] [address], [place],  [address] [phone] Mon-Fri 8:00-5:00 After hours clinic ([address] [place],  [address]) [phone] Mon-Fri 5:00-8:00, Sat 12:00-6:00, Sun 10:00-4:00 Accepting Medicaid [facility] [place]   [address], [place], [place]  [address] [phone]   Fax - [phone]  [place] ([address]) [facility] [place] [facility] [address], [place], [place] [address] [phone] Mon-Fri 8:00-5:00 [facility] [place] ([facility] [place]) [address], [place], [place] [address] [phone] Mon-Thur 8:00-7:00, Fri 8:00-5:00, Sat 8:00-12:00    

## 2022-02-04 NOTE — Progress Notes (Signed)
ROB c/o pain in her ribs, and neck 10/10, laying on her back alleviates the pain.

## 2022-02-04 NOTE — Progress Notes (Signed)
   PRENATAL VISIT NOTE  Subjective:  Meagan Richardson is a 26 y.o. G1P0 at [redacted]w[redacted]d being seen today for ongoing prenatal care.  She is currently monitored for the following issues for this low-risk pregnancy and has Shortness of breath; Supervision of normal first pregnancy, antepartum; Scoliosis; and Alpha thalassemia silent carrier on their problem list.  Patient reports no complaints.  Contractions: Not present. Vag. Bleeding: None.   . Denies leaking of fluid.   The following portions of the patient's history were reviewed and updated as appropriate: allergies, current medications, past family history, past medical history, past social history, past surgical history and problem list.   Objective:   Vitals:   02/04/22 0953  BP: 114/75  Pulse: 76  Weight: 130 lb (59 kg)    Fetal Status:           General:  Alert, oriented and cooperative. Patient is in no acute distress.  Skin: Skin is warm and dry. No rash noted.   Cardiovascular: Normal heart rate noted  Respiratory: Normal respiratory effort, no problems with respiration noted  Abdomen: Soft, gravid, appropriate for gestational age.  Pain/Pressure: Absent     Pelvic: Cervical exam deferred        Extremities: Normal range of motion.  Edema: None  Mental Status: Normal mood and affect. Normal behavior. Normal judgment and thought content.   Assessment and Plan:  Pregnancy: G1P0 at [redacted]w[redacted]d 1. Supervision of normal first pregnancy, antepartum - Doing well, no abnormal findings today -Discussed Alpha thalassemia silent carrier genetic results - AFP, Serum, Open Spina Bifida  2. [redacted] weeks gestation of pregnancy -- Peds list given and importance of choosing Peds prior to delivery discussed  -Anatomy scan scheduled for 02/10/22   Preterm labor symptoms and general obstetric precautions including but not limited to vaginal bleeding, contractions, leaking of fluid and fetal movement were reviewed in detail with the patient. Please  refer to After Visit Summary for other counseling recommendations.   Return in about 4 weeks (around 03/04/2022) for IN-PERSON, LOB.  Future Appointments  Date Time Provider [place]  02/10/2022 10:45 AM [facility]    Johnston Ebbs, NP

## 2022-02-06 LAB — AFP, SERUM, OPEN SPINA BIFIDA
AFP MoM: 1.32
AFP Value: 73.4 ng/mL
Gest. Age on Collection Date: 18.2 weeks
Maternal Age At EDD: 25.1 yr
OSBR Risk 1 IN: 9058
Test Results:: NEGATIVE
Weight: 130 [lb_av]

## 2022-02-10 ENCOUNTER — Other Ambulatory Visit: Payer: Self-pay | Admitting: Obstetrics and Gynecology

## 2022-02-10 ENCOUNTER — Ambulatory Visit: Payer: BC Managed Care – PPO | Attending: Obstetrics and Gynecology

## 2022-02-10 DIAGNOSIS — Z3400 Encounter for supervision of normal first pregnancy, unspecified trimester: Secondary | ICD-10-CM

## 2022-02-10 DIAGNOSIS — Z148 Genetic carrier of other disease: Secondary | ICD-10-CM | POA: Insufficient documentation

## 2022-02-10 DIAGNOSIS — Z3689 Encounter for other specified antenatal screening: Secondary | ICD-10-CM | POA: Insufficient documentation

## 2022-02-10 DIAGNOSIS — Z3A19 19 weeks gestation of pregnancy: Secondary | ICD-10-CM | POA: Insufficient documentation

## 2022-02-10 DIAGNOSIS — Z363 Encounter for antenatal screening for malformations: Secondary | ICD-10-CM | POA: Diagnosis not present

## 2022-03-04 ENCOUNTER — Ambulatory Visit (INDEPENDENT_AMBULATORY_CARE_PROVIDER_SITE_OTHER): Payer: BC Managed Care – PPO | Admitting: Student

## 2022-03-04 ENCOUNTER — Encounter: Payer: BC Managed Care – PPO | Admitting: Obstetrics

## 2022-03-04 VITALS — BP 111/74 | HR 75 | Wt 131.6 lb

## 2022-03-04 DIAGNOSIS — K30 Functional dyspepsia: Secondary | ICD-10-CM

## 2022-03-04 DIAGNOSIS — O219 Vomiting of pregnancy, unspecified: Secondary | ICD-10-CM

## 2022-03-04 DIAGNOSIS — Z3A22 22 weeks gestation of pregnancy: Secondary | ICD-10-CM

## 2022-03-04 DIAGNOSIS — Z3400 Encounter for supervision of normal first pregnancy, unspecified trimester: Secondary | ICD-10-CM

## 2022-03-04 MED ORDER — ONDANSETRON 4 MG PO TBDP: 4.0000 mg | ORAL_TABLET | Freq: Three times a day (TID) | ORAL | 0 refills | Status: DC | PRN

## 2022-03-04 NOTE — Progress Notes (Signed)
   PRENATAL VISIT NOTE  Subjective:  Meagan Richardson is a 26 y.o. G1P0 at [redacted]w[redacted]d being seen today for ongoing prenatal care.  She is currently monitored for the following issues for this low-risk pregnancy and has Shortness of breath; Supervision of normal first pregnancy, antepartum; Scoliosis; and Alpha thalassemia silent carrier on their problem list.  Patient reports heartburn, nausea, and vomiting x 1-2 weeks. Patient  reports that every time she eats greasy food she experiences acid reflux and is throwing up small amounts after eating.  Contractions: Not present. Vag. Bleeding: None.   . Denies leaking of fluid.   The following portions of the patient's history were reviewed and updated as appropriate: allergies, current medications, past family history, past medical history, past social history, past surgical history and problem list.   Objective:   Vitals:   03/04/22 1142  BP: 111/74  Pulse: 75  Weight: 131 lb 9.6 oz (59.7 kg)    Fetal Status: Fetal Heart Rate (bpm): 150         General:  Alert, oriented and cooperative. Patient is in no acute distress.  Skin: Skin is warm and dry. No rash noted.   Cardiovascular: Normal heart rate noted  Respiratory: Normal respiratory effort, no problems with respiration noted  Abdomen: Soft, gravid, appropriate for gestational age.  Pain/Pressure: Present     Pelvic: Cervical exam deferred        Extremities: Normal range of motion.  Edema: None  Mental Status: Normal mood and affect. Normal behavior. Normal judgment and thought content.   Assessment and Plan:  Pregnancy: G1P0 at [redacted]w[redacted]d 1. Supervision of normal first pregnancy, antepartum - Doing well, feels baby "flutters" - continue routine care  2. [redacted] weeks gestation of pregnancy -anatomy US completed and normal per MFM  3. Nausea and vomiting during pregnancy - Zofran ordered  4. Mild dietary indigestion - Encouraged to eliminate foods that are eliciting heartburn (greasy  foods are a consistent trigger for patient) - Discussed OTC options, resources and recommendations provided in witting   Preterm labor symptoms and general obstetric precautions including but not limited to vaginal bleeding, contractions, leaking of fluid and fetal movement were reviewed in detail with the patient. Please refer to After Visit Summary for other counseling recommendations.   Return in about 4 weeks (around 04/01/2022) for LOB, IN-PERSON.  Future Appointments  Date Time Provider Department Center  04/02/2022 10:35 AM Corlis Hove, NP [facility] None    Corlis Hove, NP

## 2022-03-04 NOTE — Progress Notes (Signed)
Pt presents for ROB.  Reports Chest pain x1 week.

## 2022-04-02 ENCOUNTER — Ambulatory Visit (INDEPENDENT_AMBULATORY_CARE_PROVIDER_SITE_OTHER): Payer: BC Managed Care – PPO | Admitting: Student

## 2022-04-02 VITALS — BP 108/66 | HR 83 | Wt 136.0 lb

## 2022-04-02 DIAGNOSIS — Z3400 Encounter for supervision of normal first pregnancy, unspecified trimester: Secondary | ICD-10-CM | POA: Diagnosis not present

## 2022-04-02 DIAGNOSIS — Z3A26 26 weeks gestation of pregnancy: Secondary | ICD-10-CM

## 2022-04-02 DIAGNOSIS — R0789 Other chest pain: Secondary | ICD-10-CM

## 2022-04-02 NOTE — Progress Notes (Signed)
   PRENATAL VISIT NOTE  Subjective:  Meagan Richardson is a 26 y.o. G1P0 at [redacted]w[redacted]d being seen today for ongoing prenatal care.  She is currently monitored for the following issues for this low-risk pregnancy and has Shortness of breath; Supervision of normal first pregnancy, antepartum; Scoliosis; and Alpha thalassemia silent carrier on their problem list.  Patient reports  pain/discomfort in chest and breast tissue . Patient reports that when she turns her shoulders or moves one of her arms at her side she feels soreness in her chest. Denies any SOB, fatigue, lightheadedness, dizziness, n/v, or headaches. Patient also shared that she has noticed herself craving ice throughout the day and is eating 2xcups/day.  Contractions: Irritability. Vag. Bleeding: None.  Movement: Present. Denies leaking of fluid.   The following portions of the patient's history were reviewed and updated as appropriate: allergies, current medications, past family history, past medical history, past social history, past surgical history and problem list.   Objective:   Vitals:   04/02/22 1042  BP: 108/66  Pulse: 83  Weight: 136 lb (61.7 kg)    Fetal Status: Fetal Heart Rate (bpm): 165   Movement: Present     General:  Alert, oriented and cooperative. Patient is in no acute distress.  Skin: Skin is warm and dry. No rash noted.   Cardiovascular: Normal heart rate noted  Respiratory: Normal respiratory effort, no problems with respiration noted  Abdomen: Soft, gravid, appropriate for gestational age.  Pain/Pressure: Present     Pelvic: Cervical exam deferred        Extremities: Normal range of motion.  Edema: None  Mental Status: Normal mood and affect. Normal behavior. Normal judgment and thought content.   Assessment and Plan:  Pregnancy: G1P0 at [redacted]w[redacted]d 1. Supervision of normal first pregnancy, antepartum - Doing well, reports frequent and vigorous fetal movement  2. [redacted] weeks gestation of pregnancy - Plan for  gtt/tdap at next visit - Discussed benefits of receiving tdap, patient is undecided at this time  3. Chest pain, musculoskeletal - Presentation appears MK related. No SOB, chest pain at rest or increased discomfort with normal activity.  - Provided reassurance that this may not be anything alarming and baby may be growing and causing mother to experience new discomforts. Also encouraged to wear supportive undergarments to see if any improvement is noted.  - Would like to collect repeat CBC to r/o anemia in the setting of increased desire to eat ice and also provide reassurance to patient.  Preterm labor symptoms and general obstetric precautions including but not limited to vaginal bleeding, contractions, leaking of fluid and fetal movement were reviewed in detail with the patient. Please refer to After Visit Summary for other counseling recommendations.   Return in about 2 weeks (around 04/16/2022) for IN-PERSON, LOB/GTT.  Future Appointments  Date Time Provider Department Center  04/17/2022  9:00 AM [facility] None  04/17/2022  9:35 AM Adam Phenix, MD [facility] None    Corlis Hove, NP

## 2022-04-02 NOTE — Progress Notes (Signed)
ROB 26.[redacted] wks GA Reports some "cramping" down low like period cramps, advised to empty bladder, drink water and rest when this happens and seek care in [facility] if it does not resolve. Reports "chest pain", reports pain when turning side to side, advised sports bra or other supportive bra.

## 2022-04-03 LAB — CBC
Hematocrit: 32.7 % — ABNORMAL LOW (ref 34.0–46.6)
Hemoglobin: 10.5 g/dL — ABNORMAL LOW (ref 11.1–15.9)
MCH: 25.1 pg — ABNORMAL LOW (ref 26.6–33.0)
MCHC: 32.1 g/dL (ref 31.5–35.7)
MCV: 78 fL — ABNORMAL LOW (ref 79–97)
Platelets: 275 10*3/uL (ref 150–450)
RBC: 4.19 x10E6/uL (ref 3.77–5.28)
RDW: 14.2 % (ref 11.7–15.4)
WBC: 13.2 10*3/uL — ABNORMAL HIGH (ref 3.4–10.8)

## 2022-04-15 ENCOUNTER — Other Ambulatory Visit: Payer: Self-pay | Admitting: Student

## 2022-04-15 DIAGNOSIS — O99013 Anemia complicating pregnancy, third trimester: Secondary | ICD-10-CM

## 2022-04-15 MED ORDER — FERROUS SULFATE 325 (65 FE) MG PO TBEC: 325.0000 mg | DELAYED_RELEASE_TABLET | ORAL | 1 refills | Status: DC

## 2022-04-15 NOTE — Progress Notes (Signed)
   PRENATAL VISIT NOTE  Subjective:  Meagan Richardson is a 26 y.o. G1P0 at [redacted]w[redacted]d being seen today for ongoing prenatal care.  She is currently monitored for the following issues for this low-risk pregnancy and has Shortness of breath; Supervision of normal first pregnancy, antepartum; Scoliosis; and Alpha thalassemia silent carrier on their problem list.  Patient reports no complaints.  Contractions: Irritability. Vag. Bleeding: None.  Movement: Present. Denies leaking of fluid.   The following portions of the patient's history were reviewed and updated as appropriate: allergies, current medications, past family history, past medical history, past social history, past surgical history and problem list.   Objective:   Vitals:   04/18/22 0903  BP: 119/77  Pulse: 81  Weight: 142 lb 3.2 oz (64.5 kg)    Fetal Status: Fetal Heart Rate (bpm): 146 Fundal Height: 28 cm Movement: Present     General:  Alert, oriented and cooperative. Patient is in no acute distress.  Skin: Skin is warm and dry. No rash noted.   Cardiovascular: Normal heart rate noted  Respiratory: Normal respiratory effort, no problems with respiration noted  Abdomen: Soft, gravid, appropriate for gestational age.  Pain/Pressure: Present     Pelvic: Cervical exam deferred        Extremities: Normal range of motion.  Edema: Trace  Mental Status: Normal mood and affect. Normal behavior. Normal judgment and thought content.   Assessment and Plan:  Pregnancy: G1P0 at [redacted]w[redacted]d 1. Supervision of normal first pregnancy, antepartum 28 week labs today Offered and recommended TDAP - pt accepts Rh positive Plans nexplanon IP  Preterm labor symptoms and general obstetric precautions including but not limited to vaginal bleeding, contractions, leaking of fluid and fetal movement were reviewed in detail with the patient. Please refer to After Visit Summary for other counseling recommendations.   Return in about 2 weeks (around  05/02/2022) for OB VISIT, MD or APP.  No future appointments.   Milas Hock, MD

## 2022-04-17 ENCOUNTER — Other Ambulatory Visit: Payer: BC Managed Care – PPO

## 2022-04-17 ENCOUNTER — Encounter: Payer: BC Managed Care – PPO | Admitting: Obstetrics & Gynecology

## 2022-04-18 ENCOUNTER — Ambulatory Visit (INDEPENDENT_AMBULATORY_CARE_PROVIDER_SITE_OTHER): Payer: BC Managed Care – PPO | Admitting: Obstetrics and Gynecology

## 2022-04-18 ENCOUNTER — Encounter: Payer: Self-pay | Admitting: Obstetrics and Gynecology

## 2022-04-18 ENCOUNTER — Other Ambulatory Visit: Payer: BC Managed Care – PPO

## 2022-04-18 VITALS — BP 119/77 | HR 81 | Wt 142.2 lb

## 2022-04-18 DIAGNOSIS — Z3403 Encounter for supervision of normal first pregnancy, third trimester: Secondary | ICD-10-CM | POA: Diagnosis not present

## 2022-04-18 DIAGNOSIS — Z23 Encounter for immunization: Secondary | ICD-10-CM | POA: Diagnosis not present

## 2022-04-18 DIAGNOSIS — Z3A28 28 weeks gestation of pregnancy: Secondary | ICD-10-CM

## 2022-04-18 DIAGNOSIS — Z3400 Encounter for supervision of normal first pregnancy, unspecified trimester: Secondary | ICD-10-CM | POA: Diagnosis not present

## 2022-04-19 ENCOUNTER — Encounter: Payer: Self-pay | Admitting: Obstetrics and Gynecology

## 2022-04-19 DIAGNOSIS — O24419 Gestational diabetes mellitus in pregnancy, unspecified control: Secondary | ICD-10-CM | POA: Insufficient documentation

## 2022-04-19 LAB — CBC
Hematocrit: 33.3 % — ABNORMAL LOW (ref 34.0–46.6)
Hemoglobin: 10.8 g/dL — ABNORMAL LOW (ref 11.1–15.9)
MCH: 25.2 pg — ABNORMAL LOW (ref 26.6–33.0)
MCHC: 32.4 g/dL (ref 31.5–35.7)
MCV: 78 fL — ABNORMAL LOW (ref 79–97)
Platelets: 285 10*3/uL (ref 150–450)
RBC: 4.29 x10E6/uL (ref 3.77–5.28)
RDW: 14.1 % (ref 11.7–15.4)
WBC: 11.7 10*3/uL — ABNORMAL HIGH (ref 3.4–10.8)

## 2022-04-19 LAB — RPR: RPR Ser Ql: NONREACTIVE

## 2022-04-19 LAB — GLUCOSE TOLERANCE, 2 HOURS W/ 1HR
Glucose, 1 hour: 168 mg/dL (ref 70–179)
Glucose, 2 hour: 163 mg/dL — ABNORMAL HIGH (ref 70–152)
Glucose, Fasting: 80 mg/dL (ref 70–91)

## 2022-04-19 LAB — HIV ANTIBODY (ROUTINE TESTING W REFLEX): HIV Screen 4th Generation wRfx: NONREACTIVE

## 2022-04-22 ENCOUNTER — Inpatient Hospital Stay (HOSPITAL_COMMUNITY)
Admission: AD | Admit: 2022-04-22 | Discharge: 2022-04-23 | Disposition: A | Discharge status: Home / Self Care | Payer: BC Managed Care – PPO | Attending: Obstetrics and Gynecology | Admitting: Obstetrics and Gynecology

## 2022-04-22 ENCOUNTER — Encounter (HOSPITAL_COMMUNITY): Payer: Self-pay | Admitting: Obstetrics and Gynecology

## 2022-04-22 ENCOUNTER — Inpatient Hospital Stay (HOSPITAL_COMMUNITY): Payer: BC Managed Care – PPO

## 2022-04-22 ENCOUNTER — Other Ambulatory Visit: Payer: Self-pay | Admitting: *Deleted

## 2022-04-22 ENCOUNTER — Other Ambulatory Visit: Payer: Self-pay

## 2022-04-22 DIAGNOSIS — O24410 Gestational diabetes mellitus in pregnancy, diet controlled: Secondary | ICD-10-CM

## 2022-04-22 DIAGNOSIS — M549 Dorsalgia, unspecified: Secondary | ICD-10-CM | POA: Diagnosis not present

## 2022-04-22 DIAGNOSIS — M40204 Unspecified kyphosis, thoracic region: Secondary | ICD-10-CM | POA: Diagnosis not present

## 2022-04-22 DIAGNOSIS — M79605 Pain in left leg: Secondary | ICD-10-CM | POA: Diagnosis not present

## 2022-04-22 DIAGNOSIS — O99353 Diseases of the nervous system complicating pregnancy, third trimester: Secondary | ICD-10-CM

## 2022-04-22 DIAGNOSIS — M79604 Pain in right leg: Secondary | ICD-10-CM | POA: Diagnosis not present

## 2022-04-22 DIAGNOSIS — R29898 Other symptoms and signs involving the musculoskeletal system: Secondary | ICD-10-CM | POA: Diagnosis not present

## 2022-04-22 DIAGNOSIS — Z3A29 29 weeks gestation of pregnancy: Secondary | ICD-10-CM | POA: Diagnosis not present

## 2022-04-22 DIAGNOSIS — R000 Tachycardia, unspecified: Secondary | ICD-10-CM | POA: Insufficient documentation

## 2022-04-22 DIAGNOSIS — O212 Late vomiting of pregnancy: Secondary | ICD-10-CM | POA: Insufficient documentation

## 2022-04-22 DIAGNOSIS — M419 Scoliosis, unspecified: Secondary | ICD-10-CM | POA: Diagnosis not present

## 2022-04-22 DIAGNOSIS — M4126 Other idiopathic scoliosis, lumbar region: Secondary | ICD-10-CM | POA: Diagnosis not present

## 2022-04-22 DIAGNOSIS — O26893 Other specified pregnancy related conditions, third trimester: Secondary | ICD-10-CM | POA: Insufficient documentation

## 2022-04-22 DIAGNOSIS — R531 Weakness: Secondary | ICD-10-CM | POA: Insufficient documentation

## 2022-04-22 DIAGNOSIS — M545 Low back pain, unspecified: Secondary | ICD-10-CM | POA: Diagnosis not present

## 2022-04-22 LAB — LIPASE, BLOOD: Lipase: 23 U/L (ref 11–51)

## 2022-04-22 LAB — COMPREHENSIVE METABOLIC PANEL
ALT: 22 U/L (ref 0–44)
AST: 29 U/L (ref 15–41)
Albumin: 2.8 g/dL — ABNORMAL LOW (ref 3.5–5.0)
Alkaline Phosphatase: 69 U/L (ref 38–126)
Anion gap: 8 (ref 5–15)
BUN: 5 mg/dL — ABNORMAL LOW (ref 6–20)
CO2: 20 mmol/L — ABNORMAL LOW (ref 22–32)
Calcium: 9.2 mg/dL (ref 8.9–10.3)
Chloride: 105 mmol/L (ref 98–111)
Creatinine, Ser: 0.69 mg/dL (ref 0.44–1.00)
GFR, Estimated: >60 mL/min (ref >60)
Glucose, Bld: 102 mg/dL — ABNORMAL HIGH (ref 70–99)
Potassium: 3.4 mmol/L — ABNORMAL LOW (ref 3.5–5.1)
Sodium: 133 mmol/L — ABNORMAL LOW (ref 135–145)
Total Bilirubin: 0.6 mg/dL (ref 0.3–1.2)
Total Protein: 5.9 g/dL — ABNORMAL LOW (ref 6.5–8.1)

## 2022-04-22 LAB — CBC
HCT: 29.3 % — ABNORMAL LOW (ref 36.0–46.0)
Hemoglobin: 9.5 g/dL — ABNORMAL LOW (ref 12.0–15.0)
MCH: 24.7 pg — ABNORMAL LOW (ref 26.0–34.0)
MCHC: 32.4 g/dL (ref 30.0–36.0)
MCV: 76.1 fL — ABNORMAL LOW (ref 80.0–100.0)
Platelets: 239 10*3/uL (ref 150–400)
RBC: 3.85 MIL/uL — ABNORMAL LOW (ref 3.87–5.11)
RDW: 14.6 % (ref 11.5–15.5)
WBC: 13.5 10*3/uL — ABNORMAL HIGH (ref 4.0–10.5)
nRBC: 0 % (ref 0.0–0.2)

## 2022-04-22 LAB — GLUCOSE, CAPILLARY: Glucose-Capillary: 133 mg/dL — ABNORMAL HIGH (ref 70–99)

## 2022-04-22 LAB — AMYLASE: Amylase: 85 U/L (ref 28–100)

## 2022-04-22 MED ORDER — ACCU-CHEK GUIDE W/DEVICE KIT: 1.0000 | PACK | Freq: Four times a day (QID) | 0 refills | Status: AC

## 2022-04-22 MED ORDER — ACCU-CHEK SOFTCLIX LANCETS MISC: 1.0000 | Freq: Four times a day (QID) | 12 refills | Status: AC

## 2022-04-22 MED ORDER — TRAMADOL HCL 50 MG PO TABS
50.0000 mg | ORAL_TABLET | Freq: Once | ORAL | Status: DC
Start: 2022-04-22 — End: 2022-04-22

## 2022-04-22 MED ORDER — LACTATED RINGERS IV SOLN: Freq: Once | INTRAVENOUS | Status: AC

## 2022-04-22 MED ORDER — OXYCODONE-ACETAMINOPHEN 5-325 MG PO TABS
1.0000 | ORAL_TABLET | Freq: Four times a day (QID) | ORAL | Status: DC | PRN
  Administered 2022-04-22: 1 via ORAL
  Filled 2022-04-22: qty 1

## 2022-04-22 MED ORDER — ACCU-CHEK GUIDE VI STRP: ORAL_STRIP | 12 refills | Status: AC

## 2022-04-22 NOTE — Progress Notes (Signed)
TC to inform pt of GDM, referral to educator and supplies sent. Pt verbalized understanding. Questions answered. Education sent via MyChart.

## 2022-04-22 NOTE — MAU Provider Note (Incomplete)
Chief Complaint:  Emesis   Event Date/Time   First Provider Initiated Contact with Patient 04/22/22 2025     HPI: Meagan Richardson is a 26 y.o. G1P0 at 24w2dho presents to maternity admissions reporting weakness and pain in both legs since this afternoon  States also has weakness in both arms.  States cannot bear any weight due to pain and weakness.  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  States pain is deep in her bones.  Had gone to eat because she thought the weakness was due to low blood sugar, but vomited and her weakness did not improve.  After that, boyfriend states he had to carry her to car. . She reports good fetal movement, denies LOF, vaginal bleeding, h/a, dizziness, diarrhea, or fever/chills.    Emesis  This is a new problem. The current episode started today. The problem occurs less than 2 times per day. The problem has been resolved. There has been no fever. Associated symptoms include abdominal pain (LLQ dull pain). Pertinent negatives include no arthralgias, chest pain, chills, diarrhea, dizziness, fever, headaches or myalgias (but has "deep pain" in legs). She has tried nothing for the symptoms.  Other This is a new (pain and weakness in arms and legs) problem. The current episode started today. The problem has been unchanged. Associated symptoms include abdominal pain (LLQ dull pain), fatigue, vomiting and weakness. Pertinent negatives include no arthralgias, chest pain, chills, fever, headaches, myalgias (but has "deep pain" in legs), numbness, urinary symptoms, vertigo or visual change. The symptoms are aggravated by exertion, standing, walking and twisting. She has tried nothing for the symptoms.   RN Note: Meagan SCHMOLLis a 26y.o. at 223w2dere in MAU reporting: Pt reports she was taking a nap and had her legs curled under her. Pt reports she woke up from her nap because her legs were feeling weak. Pt reports she woke up and was out of  breath and could barely walk. Pt reports she drove to [employer] and ordered fried chicken tenders and french fries. Pt report she ate some of it, but then threw it up. Pt reports while she was vomiting her legs felt weak.    Onset of complaint: today at 1430 Pain score: 6/10 legs    Past Medical History: Past Medical History:  Diagnosis Date   Scoliosis     Past obstetric history: OB History  Gravida Para Term Preterm AB Living  1         0  SAB IAB Ectopic Multiple Live Births               # Outcome Date GA Lbr Len/2nd Weight Sex Delivery Anes PTL Lv  1 Current             Past Surgical History: History reviewed. No pertinent surgical history.  Family History: History reviewed. No pertinent family history.  Social History: Social History   Tobacco Use   Smoking status: Never   Smokeless tobacco: Never  Vaping Use   Vaping Use: Never used  Substance Use Topics   Alcohol use: No   Drug use: No    Allergies: No Known Allergies  Meds:  Medications Prior to Admission  Medication Sig Dispense Refill Last Dose   Accu-Chek Softclix Lancets lancets 1 each by Other route 4 (four) times daily. 100 each 12    Blood Glucose Monitoring Suppl (ACCU-CHEK GUIDE) w/Device KIT 1 Device by Does not apply route  4 (four) times daily. 1 kit 0    ferrous sulfate 325 (65 FE) MG EC tablet Take 1 tablet (325 mg total) by mouth every other day. 45 tablet 1    glucose blood (ACCU-CHEK GUIDE) test strip Use to check blood sugars four times a day was instructed 50 each 12    Prenatal Vit-Fe Fumarate-FA (MULTIVITAMIN-PRENATAL) 27-0.8 MG TABS tablet Take 1 tablet by mouth daily at 12 noon.       I have reviewed patient's Past Medical Hx, Surgical Hx, Family Hx, Social Hx, medications and allergies.   ROS:  Review of Systems  Constitutional:  Positive for fatigue. Negative for chills and fever.  Cardiovascular:  Negative for chest pain.  Gastrointestinal:  Positive for abdominal pain (LLQ  dull pain) and vomiting. Negative for diarrhea.  Musculoskeletal:  Negative for arthralgias and myalgias (but has "deep pain" in legs).  Neurological:  Positive for weakness. Negative for dizziness, vertigo, numbness and headaches.   Other systems negative  Physical Exam  No data found. Constitutional: Well-developed, well-nourished female in no acute distress.  Cardiovascular: normal rate and rhythm Respiratory: normal effort, clear to auscultation bilaterally GI: Abd soft, non-tender, gravid appropriate for gestational age.   No rebound or guarding. MS: Extremities nontender, no edema, normal ROM Neurologic: Alert and oriented x 4.  GU: Neg CVAT.   FHT:  Baseline 155 , moderate variability, accelerations present, no decelerations Contractions: Uterine irritability   Labs:  A/Positive/-- (05/02 1059)  Imaging:  No results found.  MAU Course/MDM: I have reviewed the triage vital signs and the nursing notes.   Pertinent labs & imaging results that were available during my care of the patient were reviewed by me and considered in my medical decision making (see chart for details).      I have reviewed her medical records including past results, notes and treatments.   I have ordered labs and reviewed results.  NST reviewed, reactive but tachycardic (patient is  Consult Dr Damita Dunnings with presentation, exam findings and test results. She recommends Neurology Consult.  Treatments in MAU included IV hydration and Percocet for pain.    Neurologist states there are no major neurological deficits.  Pt able to bear weight  She does recommend MR of Thoracic  and Lumbar spine to make sure there are no new herniations. .    Assessment:   Plan: Discharge home Labor precautions and fetal kick counts Follow up in Office for prenatal visits and recheck   Pt stable at time of discharge.  Hansel Feinstein CNM, MSN Certified Nurse-Midwife 04/22/2022 8:25 PM

## 2022-04-22 NOTE — Consult Note (Signed)
Neurology Consultation Reason for Consult: Weakness  Requesting Physician: Hansel Feinstein  CC: Leg pain   History is obtained from: Patient, chart review   HPI: SOLEY HARRISS is a 26 y.o. female with a past medical history significant for lumbar and thoracic scoliosis complicated by pain, 29 weeks and 2 days pregnant, gestational diabetes.  She had lay down for nap with her legs curled up and was awoken by severe pain in her legs.  She describes this as pain in both of her legs shooting down to her heels, exacerbated by movement and improved with rest.  When she is acutely in pain she is very short of breath, and with this she also had bilateral tingling in her hands.  She initially thought she may simply be hypoglycemic as she had not eaten all day, but then had emesis after she tried to eat with subsequent worsened pain/weakness.  She reports normal bowel movement yesterday, no bowel movement yet today, normal urination today without loss of perianal or genital sensation.  ROS: All other review of systems was negative except as noted in the HPI.  Past Medical History:  Diagnosis Date   Scoliosis    History reviewed. No pertinent surgical history.  Current Outpatient Medications  Medication Instructions   Accu-Chek Softclix Lancets lancets 1 each, Other, 4 times daily   Blood Glucose Monitoring Suppl (ACCU-CHEK GUIDE) w/Device KIT 1 Device, Does not apply, 4 times daily   ferrous sulfate 325 mg, Oral, Every other day   glucose blood (ACCU-CHEK GUIDE) test strip Use to check blood sugars four times a day was instructed   Prenatal Vit-Fe Fumarate-FA (MULTIVITAMIN-PRENATAL) 27-0.8 MG TABS tablet 1 tablet, Oral, Daily   History reviewed. No pertinent family history.   Social History:  reports that she has never smoked. She has never used smokeless tobacco. She reports that she does not drink alcohol and does not use drugs.   Exam: Current vital signs: BP 115/66   Pulse (!) 122    Temp 99 F (37.2 C)   Resp 16   LMP 09/29/2021   SpO2 98%  Vital signs in last 24 hours: Temp:  [99 F (37.2 C)] 99 F (37.2 C) (08/15 2014) Pulse Rate:  [122] 122 (08/15 2014) Resp:  [16] 16 (08/15 2014) BP: (115)/(66) 115/66 (08/15 2014) SpO2:  [98 %] 98 % (08/15 2014)   Physical Exam  Constitutional: Appears well-developed and well-nourished.  Psych: Affect appropriate to situation, cooperative but wincing in pain Eyes: No scleral injection HENT: No oropharyngeal obstruction.  MSK: Thoracic and lumbar scoliosis without pain to palpation anywhere along the spine Cardiovascular: Regular rhythm but tachycardic  Respiratory: Effort normal, non-labored breathing at rest, but short of breath with movement/pain GI: Gravid abdomen with fetal monitoring in place Skin: Warm dry and intact visible skin  Neuro: Mental Status: Patient is awake, alert, oriented to person, place, month, year, and situation. Patient is able to give a clear and coherent history. No signs of aphasia or neglect Cranial Nerves: II: Visual Fields are full. Pupils are equal, round, and reactive to light.   III,IV, VI: EOMI without ptosis or diploplia.  V: Facial sensation is reduced to temperature but not pinprick on the right face  VII: Facial movement is symmetric.  VIII: hearing is intact to voice X: Uvula elevates symmetrically XI: Shoulder shrug is symmetric. XII: tongue is midline without atrophy or fasciculations.  Motor: Tone is normal. Bulk is normal.  Variable effort on examination, at least 4/5  throughout.  Particularly poor effort in the lower extremities on confrontational strength testing.  However see amputation Sensory: Sensation is symmetric to light touch in the arms and legs, but reports reduced temperature sensation but not pinprick sensation on the right face and arm Deep Tendon Reflexes: 2+ and symmetric triceps, biceps, brachioradialis, patellae, ankles.  No clonus, no  Hoffmann's Plantars: Toes are downgoing bilaterally.  Cerebellar: FNF and HKS are intact bilaterally Gait:  Stands semi crouched for over 30 seconds reporting significant pain and some trembling in her legs towards the end of this time   I have reviewed labs in epic and the results pertinent to this consultation are:  Basic Metabolic Panel: Recent Labs  Lab 04/22/22 2036  NA 133*  K 3.4*  CL 105  CO2 20*  GLUCOSE 102*  BUN 5*  CREATININE 0.69  CALCIUM 9.2    CBC: Recent Labs  Lab 04/18/22 0954 04/22/22 2036  WBC 11.7* 13.5*  HGB 10.8* 9.5*  HCT 33.3* 29.3*  MCV 78* 76.1*  PLT 285 239    Coagulation Studies: No results for input(s): "LABPROT", "INR" in the last 72 hours. ]   I have reviewed the images obtained:  MRI T- and L-spine pending and will be reviewed   Impression: Most likely an exacerbation of her chronic scoliosis pain in the setting of pregnancy.  Examination is mostly pain limited, in particular given her good strength for standing semicrouched I am overall reassured.  However MRI lumbar and thoracic spine to rule out acute process is reasonable due to the limitations of her participation in examination secondary to her acute pain.  Most likely the bilateral hand tingling that she experienced was secondary to hyperventilation secondary to pain.  Sensation changes in the right arm and face are nonphysiologic.  Recommendations: -MRI lumbar and thoracic spine without contrast -If these are negative for acute process, recommend physical therapy; muscle relaxers/pain control per OB team -Recommendations discussed in person with Hansel Feinstein.  Neurology will follow up imaging but otherwise be available as needed  [place] [phone] Available 7 PM to 7 AM, outside of these hours please call Neurologist on call as listed on Amion.  MRI L- and T-spine personally reviewed, agree with radiology:   1. Scoliosis. 2. No  spinal canal stenosis or neural foraminal narrowing. 3. Transitional anatomy, with 4 lumbar-type vertebral bodies noted. Please correlate with imaging if any intervention is planned.  Plan as above

## 2022-04-22 NOTE — MAU Note (Signed)
.  Meagan Richardson is a 26 y.o. at [redacted]w[redacted]d here in MAU reporting: Pt reports she was taking a nap and had her legs curled under her. Pt reports she woke up from her nap because her legs were feeling weak. Pt reports she woke up and was out of breath and could barely walk. Pt reports she drove to [employer] and ordered fried chicken tenders and french fries. Pt report she ate some of it, but then threw it up. Pt reports while she was vomiting her legs felt weak.    Onset of complaint: today at 1430 Pain score: 6/10 legs  There were no vitals filed for this visit.   Lab orders placed from triage:   none

## 2022-04-23 ENCOUNTER — Other Ambulatory Visit: Payer: Self-pay

## 2022-04-23 DIAGNOSIS — O212 Late vomiting of pregnancy: Secondary | ICD-10-CM | POA: Diagnosis not present

## 2022-04-23 DIAGNOSIS — Z3A29 29 weeks gestation of pregnancy: Secondary | ICD-10-CM | POA: Diagnosis not present

## 2022-04-23 DIAGNOSIS — M40204 Unspecified kyphosis, thoracic region: Secondary | ICD-10-CM | POA: Diagnosis not present

## 2022-04-23 DIAGNOSIS — R29898 Other symptoms and signs involving the musculoskeletal system: Secondary | ICD-10-CM | POA: Diagnosis not present

## 2022-04-23 DIAGNOSIS — M79604 Pain in right leg: Secondary | ICD-10-CM | POA: Diagnosis not present

## 2022-04-23 DIAGNOSIS — M549 Dorsalgia, unspecified: Secondary | ICD-10-CM | POA: Diagnosis not present

## 2022-04-23 DIAGNOSIS — M419 Scoliosis, unspecified: Secondary | ICD-10-CM

## 2022-04-23 DIAGNOSIS — M79605 Pain in left leg: Secondary | ICD-10-CM | POA: Diagnosis not present

## 2022-04-23 DIAGNOSIS — M4126 Other idiopathic scoliosis, lumbar region: Secondary | ICD-10-CM | POA: Diagnosis not present

## 2022-04-23 DIAGNOSIS — M545 Low back pain, unspecified: Secondary | ICD-10-CM | POA: Diagnosis not present

## 2022-04-23 MED ORDER — CYCLOBENZAPRINE HCL 5 MG PO TABS
5.0000 mg | ORAL_TABLET | Freq: Three times a day (TID) | ORAL | 0 refills | Status: DC | PRN
Start: 2022-04-23 — End: 2022-05-19

## 2022-04-30 ENCOUNTER — Ambulatory Visit: Payer: BC Managed Care – PPO | Admitting: Registered"

## 2022-05-07 ENCOUNTER — Encounter: Payer: Self-pay | Admitting: Obstetrics and Gynecology

## 2022-05-07 ENCOUNTER — Ambulatory Visit: Payer: BC Managed Care – PPO

## 2022-05-07 ENCOUNTER — Ambulatory Visit (INDEPENDENT_AMBULATORY_CARE_PROVIDER_SITE_OTHER): Payer: BC Managed Care – PPO | Admitting: Student

## 2022-05-07 VITALS — BP 108/64 | HR 89 | Wt 139.3 lb

## 2022-05-07 DIAGNOSIS — O99013 Anemia complicating pregnancy, third trimester: Secondary | ICD-10-CM

## 2022-05-07 DIAGNOSIS — K30 Functional dyspepsia: Secondary | ICD-10-CM

## 2022-05-07 DIAGNOSIS — Z3A31 31 weeks gestation of pregnancy: Secondary | ICD-10-CM

## 2022-05-07 DIAGNOSIS — Z3400 Encounter for supervision of normal first pregnancy, unspecified trimester: Secondary | ICD-10-CM

## 2022-05-07 DIAGNOSIS — O24410 Gestational diabetes mellitus in pregnancy, diet controlled: Secondary | ICD-10-CM

## 2022-05-07 DIAGNOSIS — Z3403 Encounter for supervision of normal first pregnancy, third trimester: Secondary | ICD-10-CM

## 2022-05-07 MED ORDER — PANTOPRAZOLE SODIUM 40 MG PO TBEC: 40.0000 mg | DELAYED_RELEASE_TABLET | Freq: Every day | ORAL | 0 refills | Status: AC

## 2022-05-07 NOTE — Progress Notes (Signed)
   PRENATAL VISIT NOTE  Subjective:  Meagan Richardson is a 26 y.o. G1P0 at [redacted]w[redacted]d being seen today for ongoing prenatal care.  She is currently monitored for the following issues for this low-risk pregnancy and has Shortness of breath; Supervision of normal first pregnancy, antepartum; Scoliosis; Alpha thalassemia silent carrier; and Gestational diabetes on their problem list.  Patient reports heartburn.  Contractions: Irritability. Vag. Bleeding: None.  Movement: Present. Denies leaking of fluid.   The following portions of the patient's history were reviewed and updated as appropriate: allergies, current medications, past family history, past medical history, past social history, past surgical history and problem list.   Objective:   Vitals:   05/07/22 1642  BP: 108/64  Pulse: 89  Weight: 139 lb 4.8 oz (63.2 kg)    Fetal Status: Fetal Heart Rate (bpm): 154 Fundal Height: 31 cm Movement: Present     General:  Alert, oriented and cooperative. Patient is in no acute distress.  Skin: Skin is warm and dry. No rash noted.   Cardiovascular: Normal heart rate noted  Respiratory: Normal respiratory effort, no problems with respiration noted  Abdomen: Soft, gravid, appropriate for gestational age.  Pain/Pressure: Absent     Pelvic: Cervical exam deferred        Extremities: Normal range of motion.  Edema: None  Mental Status: Normal mood and affect. Normal behavior. Normal judgment and thought content.   Assessment and Plan:  Pregnancy: G1P0 at [redacted]w[redacted]d 1. Supervision of normal first pregnancy, antepartum -FHT WDL, Frequent and vigorous fetal movement  2. [redacted] weeks gestation of pregnancy - Routine follow-up  3. Diet controlled gestational diabetes mellitus (GDM) in third trimester - Patient checking blood sugars at home, but does not have log to track readings. Patient states that her fasting sugars are all WDL. However, patient stated her 2hr PP have been elevated as high as 196. Unsure  of other readings. Patient was not able to attend GDM class with RD.  Discussed the risks of elevated blood glucose levels in pregnancy and delivery. Encouraged healthier snacking and protein rich, low-carb diet. Benefits of mild exercise such as walking encouraged. Patient to bring log to next appointment.  - Growth [place] ordered  4. Anemia affecting pregnancy in third trimester - Experienced intermittent constipation with Iron tablet.  - Encouraged patient to trial colace or Miralax with continued use of tablet  5. Acid indigestion - Encouraged to incorporate TUMS - Avoid trigger foods - pantoprazole (PROTONIX) 40 MG tablet; Take 1 tablet (40 mg total) by mouth daily.  Dispense: 30 tablet; Refill: 0  Preterm labor symptoms and general obstetric precautions including but not limited to vaginal bleeding, contractions, leaking of fluid and fetal movement were reviewed in detail with the patient. Please refer to After Visit Summary for other counseling recommendations.   No follow-ups on file.  Future Appointments  Date Time Provider Department Center  05/19/2022  9:55 AM Carlynn Herald, CNM [facility] None  05/20/2022  8:45 AM Beuhring, Doyce Para, PT [facility] None  06/02/2022  9:55 AM Leftwich-Kirby, Wilmer Floor, CNM [facility] None  06/09/2022  9:55 AM Leftwich-Kirby, Wilmer Floor, CNM [facility] None  06/16/2022  9:55 AM Carlynn Herald, CNM [facility] None  06/23/2022  9:55 AM Constant, Gigi Gin, MD [facility] None  06/30/2022  9:55 AM Leftwich-Kirby, Wilmer Floor, CNM [facility] None    Corlis Hove, NP

## 2022-05-07 NOTE — Progress Notes (Signed)
Pt presents for ROB visit. Pt c/o of acid reflux and is requesting Rx.

## 2022-05-19 ENCOUNTER — Other Ambulatory Visit: Payer: Self-pay | Admitting: Student

## 2022-05-19 ENCOUNTER — Ambulatory Visit (INDEPENDENT_AMBULATORY_CARE_PROVIDER_SITE_OTHER): Payer: BC Managed Care – PPO | Admitting: Certified Nurse Midwife

## 2022-05-19 ENCOUNTER — Ambulatory Visit: Payer: BC Managed Care – PPO | Attending: Student

## 2022-05-19 ENCOUNTER — Ambulatory Visit: Payer: BC Managed Care – PPO | Admitting: *Deleted

## 2022-05-19 ENCOUNTER — Ambulatory Visit: Payer: BC Managed Care – PPO | Attending: Obstetrics and Gynecology | Admitting: Obstetrics and Gynecology

## 2022-05-19 VITALS — BP 116/75 | HR 94 | Wt 143.0 lb

## 2022-05-19 VITALS — BP 114/68 | HR 86

## 2022-05-19 DIAGNOSIS — O24419 Gestational diabetes mellitus in pregnancy, unspecified control: Secondary | ICD-10-CM

## 2022-05-19 DIAGNOSIS — Z3A33 33 weeks gestation of pregnancy: Secondary | ICD-10-CM

## 2022-05-19 DIAGNOSIS — D563 Thalassemia minor: Secondary | ICD-10-CM | POA: Diagnosis not present

## 2022-05-19 DIAGNOSIS — O24410 Gestational diabetes mellitus in pregnancy, diet controlled: Secondary | ICD-10-CM | POA: Diagnosis not present

## 2022-05-19 DIAGNOSIS — O285 Abnormal chromosomal and genetic finding on antenatal screening of mother: Secondary | ICD-10-CM | POA: Diagnosis not present

## 2022-05-19 DIAGNOSIS — M7989 Other specified soft tissue disorders: Secondary | ICD-10-CM

## 2022-05-19 DIAGNOSIS — O0993 Supervision of high risk pregnancy, unspecified, third trimester: Secondary | ICD-10-CM

## 2022-05-19 NOTE — Progress Notes (Signed)
ROB, c/o pain in right thumb 9/10 whenever she sleeps/nap x 4 weeks.

## 2022-05-19 NOTE — Progress Notes (Signed)
Maternal-Fetal Medicine   Name: Meagan Richardson DOB: 1996-07-18 MRN: [id number] Referring Provider: Corlis Hove, NP  I had the pleasure of [name] [facility]. She is G1 P0 at 33w 1d gestation and is here for fetal growth assessment. She has a new diagnosis of gestational diabetes.  Patient reports she has not been checking her blood glucose regularly and I am unable to ascertain the control of diabetes. She does not have hypertension and her blood pressure today at our office is 114/68 mmHg.  Ultrasound Fetal growth is appropriate for gestational age.  The estimated fetal weight is at the 77th percentile and the abdominal circumference measurement is at the 97th percentile.  Amniotic fluid is normal and good fetal activity seen.  Cephalic presentation.  Antenatal testing is reassuring.  BPP 8/8.   Gestational diabetes I explained the diagnosis of gestational diabetes.  I emphasized the importance of good blood glucose control to prevent adverse fetal or neonatal outcomes.  I discussed normal range of blood glucose values. I encouraged her to check her blood glucose regularly. Possible complications of gestational diabetes include fetal macrosomia, shoulder dystocia and birth injuries, stillbirth (in poorly controlled diabetes) and neonatal respiratory syndrome and other complications.  In about 85% of cases, gestational diabetes is well controlled by diet alone.  Exercise reduces the need for insulin.  Medical treatment includes oral hypoglycemics or insulin.  Timing of delivery: In well-controlled diabetes on diet, patient can be delivered at 66- or 40-weeks' gestation. Vaginal delivery is not contraindicated. Early term delivery is appropriate if diabetes is not well controlled. Type 2 diabetes develops in up to 50% of women with GDM. I recommend postpartum screening with 75-g glucose load at 6 to 12 weeks after  delivery.  Recommendations -Appointments were made for weekly BPP till delivery. -Fetal growth assessment in 4 weeks. -Timing of delivery will be based on control of diabetes. -I encouraged her to meet with the diabetic educator.  Thank you for consultation.  If you have any questions or concerns, please contact me the [facility].  Consultation including face-to-face (more than 50%) counseling 30 minutes.

## 2022-05-20 ENCOUNTER — Other Ambulatory Visit: Payer: Self-pay

## 2022-05-20 ENCOUNTER — Other Ambulatory Visit: Payer: Self-pay | Admitting: *Deleted

## 2022-05-20 ENCOUNTER — Encounter: Payer: Self-pay | Admitting: Certified Nurse Midwife

## 2022-05-20 ENCOUNTER — Encounter (HOSPITAL_COMMUNITY): Payer: Self-pay | Admitting: Certified Nurse Midwife

## 2022-05-20 ENCOUNTER — Telehealth: Payer: Self-pay | Admitting: Certified Nurse Midwife

## 2022-05-20 ENCOUNTER — Ambulatory Visit: Payer: BC Managed Care – PPO | Attending: Advanced Practice Midwife | Admitting: Physical Therapy

## 2022-05-20 ENCOUNTER — Encounter: Payer: Self-pay | Admitting: Physical Therapy

## 2022-05-20 DIAGNOSIS — M5459 Other low back pain: Secondary | ICD-10-CM | POA: Insufficient documentation

## 2022-05-20 DIAGNOSIS — O24419 Gestational diabetes mellitus in pregnancy, unspecified control: Secondary | ICD-10-CM

## 2022-05-20 DIAGNOSIS — R293 Abnormal posture: Secondary | ICD-10-CM | POA: Insufficient documentation

## 2022-05-20 DIAGNOSIS — M6281 Muscle weakness (generalized): Secondary | ICD-10-CM | POA: Diagnosis not present

## 2022-05-20 DIAGNOSIS — M419 Scoliosis, unspecified: Secondary | ICD-10-CM | POA: Insufficient documentation

## 2022-05-20 NOTE — Progress Notes (Signed)
Updates Work restrictions letter sent for patient    Deeandra Jerry Danella Deis) Suzie Portela, MSN, CNM  [facility] for [facility]  05/20/22 6:33 PM

## 2022-05-20 NOTE — Therapy (Signed)
OUTPATIENT PHYSICAL THERAPY THORACOLUMBAR EVALUATION   Patient Name: Meagan Richardson MRN: [id number] DOB:1996/01/10, 26 y.o., female Today's Date: 05/20/2022   PT End of Session - 05/20/22 1255     Visit Number 1    Date for PT Re-Evaluation 07/01/22    Authorization Type [employer]    Authorization Time Period requesting 1x/week x 6 weeks through 07/01/22    PT Start Time 0900   Pt late   PT Stop Time 0930    PT Time Calculation (min) 30 min    Activity Tolerance Patient tolerated treatment well    Behavior During Therapy [facility] for tasks assessed/performed             Past Medical History:  Diagnosis Date   Scoliosis    Past Surgical History:  Procedure Laterality Date   NO PAST SURGERIES     Patient Active Problem List   Diagnosis Date Noted   Gestational diabetes 04/19/2022   Alpha thalassemia silent carrier 01/28/2022   Supervision of normal first pregnancy, antepartum 01/07/2022   Scoliosis 01/07/2022   Shortness of breath 10/09/2016      REFERRING PROVIDER: Aviva Signs, CNM   REFERRING DIAG: M41.9 (ICD-10-CM) - Scoliosis of lumbar spine, unspecified scoliosis type   Rationale for Evaluation and Treatment Rehabilitation  THERAPY DIAG:  Other low back pain  Abnormal posture  Muscle weakness (generalized)  ONSET DATE: 04/22/22, sudden onset  SUBJECTIVE:                                                                                                                                                                                           SUBJECTIVE STATEMENT: Pt is a 26yo pregnant female ([redacted]w[redacted]d) with sudden onset bil LE weakness and SOB on waking from a nap in which she was laying on her side with legs curled up.  Was seen emergently and cleared by neuro and OBGYN. Had thoracic and lumbar MRI and was given muscle relaxer, pain meds and IV fluids.  Pt isn't taking any meds at this time for pain.  Weakness has resolved but Pt feels ongoing LBP  and Rt upper back spasm. Pt has thoracolumbar scoliosis.  PERTINENT HISTORY:  [redacted]w[redacted]d pregnant at evaluation on 05/20/22 Scoliosis New diagnosis of gestational diabetes, will be monitored  PAIN:  PAIN:  Are you having pain? Yes NPRS scale: 5-6/10 Pain location: Rt upper back, bil LBP Pain orientation: Right, Bilateral, Upper, and Lower  PAIN TYPE: sharp Pain description: intermittent  Aggravating factors: long sitting, prolonged standing/walking >10' Relieving factors: I try to crack my back but short term relief    PRECAUTIONS: Other: pregnant, EDD  07/06/22  WEIGHT BEARING RESTRICTIONS No  FALLS:  Has patient fallen in last 6 months? 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 Scoliosis. 2. No spinal canal stenosis or neural foraminal narrowing. 3. Transitional anatomy, with 4 lumbar-type vertebral bodies noted. Please correlate with imaging if any intervention is planned.  Segmentation: In keeping with the prior exam, there are 4 lumbar-type vertebral bodies, with the lowest well-formed disc space labeled L4-L5. Complete sacralization of L5.   LUMBAR: Alignment: Scoliosis, with levoscoliosis of the lumbar spine. Straightening of the normal lumbar lordosis. No listhesis.  THORACIC: Alignment: Scoliosis. Straightening of the normal thoracic kyphosis. No listhesis  PATIENT SURVEYS:  Modified Oswestry 16 = moderate disability   SCREENING FOR RED FLAGS: Bowel or bladder incontinence: Yes: pregnancy Spinal tumors: No Cauda equina syndrome: No Compression fracture: No Abdominal aneurysm: No  COGNITION:  Overall cognitive status:  Within functional limits for tasks assessed     SENSATION: WFL  MUSCLE LENGTH: Hamstrings: Right 60 deg; Left 60 deg   POSTURE: increased lumbar lordosis, decreased thoracic kyphosis, and scoliosis, Rt shoulder elevated  PALPATION: Tender bil SI joints, bil lumbar and paraspinals, Rt intrascapular muscles  LUMBAR ROM:   Active  A/PROM  eval  Flexion Fingers to mid shin, no pain  Extension 10, pain  Lt lateral flexion full  Left lateral flexion full  Right rotation 75%  Left rotation 75%   (Blank rows = not tested)  LOWER EXTREMITY ROM:     Active  Right eval Left eval  Hip flexion    Hip extension    Hip abduction    Hip adduction    Hip internal rotation    Hip external rotation 50 60  Knee flexion    Knee extension    Ankle dorsiflexion    Ankle plantarflexion    Ankle inversion    Ankle eversion     (Blank rows = not tested)  LOWER EXTREMITY MMT:   4-/5 grossly bil LE    FUNCTIONAL TESTS:  Uses bil UE to rise from chair  GAIT: Distance walked: within clinic Assistive device utilized: None Level of assistance: Modified independence Comments: slow gait, lateral lean during stance phase bil    TODAY'S TREATMENT  05/20/22: Discussion of pregnancy and posture changes Initiated HEP Check all possible CPT codes: 61950- Therapeutic Exercise     If treatment provided at initial evaluation, no treatment charged due to lack of authorization.       PATIENT EDUCATION:  Education details: Access Code: [email or web address] Person educated: Patient Education method: Programmer, multimedia, Demonstration, Verbal cues, and Handouts Education comprehension: verbalized understanding and returned demonstration   HOME EXERCISE PROGRAM: Access Code: [email or web address] URL: [email or web address] Date: 05/20/2022 Prepared by: Loistine Simas Perel Hauschild  Exercises - Seated Hip External Rotation Stretch  - 3 x daily - 7 x weekly - 1 sets - 2 reps - 30 hold - Seated Piriformis Stretch   - 3 x daily - 7 x weekly - 1 sets - 2 reps - 30 hold - Quadruped Rocking Backward  - 3 x daily - 7 x weekly - 1 sets - 20 reps - Sidelying Open Book Thoracic Rotation with Knee  on Foam Roll  - 3 x daily - 7 x weekly - 1 sets - 10 reps  ASSESSMENT:  CLINICAL IMPRESSION: Patient is a 26 y.o. pregnant female in her third trimester with scoliosis who was seen today for physical therapy evaluation and treatment for back pain. This is her first pregnancy.  She has ongoing bil LBP and Rt upper back pain related to spasms.  LE weakness has resolved since initial onset in mid-Aug.  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  She is on light duty at work.  EDD is 07/06/22.  Pt will benefit from PT for Pt education for symptom management, positioning for comfort, labor prep, stretches and strengthening.   OBJECTIVE IMPAIRMENTS Abnormal gait, decreased activity tolerance, decreased ROM, decreased strength, increased muscle spasms, impaired flexibility, postural dysfunction, and pain.   ACTIVITY LIMITATIONS carrying, lifting, bending, sitting, standing, squatting, sleeping, transfers, bed mobility, and locomotion level  PARTICIPATION LIMITATIONS: meal prep, cleaning, laundry, shopping, community activity, and occupation  PERSONAL FACTORS 1 comorbidity: gestational diabetes  are also affecting patient's functional outcome.   REHAB POTENTIAL: Good  CLINICAL DECISION MAKING: Stable/uncomplicated  EVALUATION COMPLEXITY: Low   GOALS: Goals reviewed with patient? Yes  SHORT TERM GOALS: Target date: 06/10/22  Pt will be educated on positioning for symptom management and be ind with initial HEP Baseline: Goal status: INITIAL  2.  Pt will learn how to properly recruit deep TA for lumbar support with functional movement. Baseline:  Goal status: INITIAL    LONG TERM GOALS: Target date: 07/01/22  Pt will be ind with  advanced HEP and understand how to best manage symptoms as pregnancy progresses toward delivery planning. Baseline:  Goal status: INITIAL  2.  Pt will be educated on positional options for labor and how they relate to stages of labor. Baseline:  Goal status: INITIAL  3.  Pt will report reduced back pain by at least 30% Baseline:  Goal status: INITIAL  4.  Pt will score 14 or less on ODI to demo reduced disability (from moderate to mild) Baseline: 16 Goal status: INITIAL     PLAN: PT FREQUENCY: 1x/week  PT DURATION: 6 weeks  PLANNED INTERVENTIONS: Therapeutic exercises, Therapeutic activity, Neuromuscular re-education, Patient/Family education, Self Care, Joint mobilization, Electrical stimulation, Spinal mobilization, Moist heat, Taping, and Manual therapy.  PLAN FOR NEXT SESSION: review HEP, gentle spine ROM (try mad cat, seated hip IR/ER), intro TA in SL or sitting, LE functional strength, STM lumbar and thoracic paraspinals and Rt intrascapular, educate on positions for labor    Morton Peters, PT 05/20/22 12:57 PM

## 2022-05-20 NOTE — Progress Notes (Signed)
PRENATAL VISIT NOTE  Subjective:  Meagan Richardson is a 26 y.o. G1P0 at [redacted]w[redacted]d being seen today for ongoing prenatal care.  She is currently monitored for the following issues for this high-risk pregnancy and has Shortness of breath; Supervision of normal first pregnancy, antepartum; Scoliosis; Alpha thalassemia silent carrier; and Gestational diabetes on their problem list.  Patient reports  pain in her right thumb when pressure is applied. She denies injury. Denies attempting to relieve pain. Patient states she only feels it at night and certain times throughout the day. She also complains of swelling in her hands .  Contractions: Irritability. Vag. Bleeding: None.  Movement: Present. Denies leaking of fluid.   The following portions of the patient's history were reviewed and updated as appropriate: allergies, current medications, past family history, past medical history, past social history, past surgical history and problem list.   Objective:   Vitals:   05/19/22 1008  BP: 116/75  Pulse: 94  Weight: 143 lb (64.9 kg)    Fetal Status: Fetal Heart Rate (bpm): 145 Fundal Height: 32 cm Movement: Present     General:  Alert, oriented and cooperative. Patient is in no acute distress.  Skin: Skin is warm and dry. No rash noted.   Cardiovascular: Normal heart rate noted  Respiratory: Normal respiratory effort, no problems with respiration noted  Abdomen: Soft, gravid, appropriate for gestational age.  Pain/Pressure: Present     Pelvic: Cervical exam deferred        Extremities: Normal range of motion.  Edema: None  Mental Status: Normal mood and affect. Normal behavior. Normal judgment and thought content.   Assessment and Plan:  Pregnancy: G1P0 at [redacted]w[redacted]d 1. Supervision of high risk pregnancy in third trimester - Patient feeling frequent and vigorous fetal movement.   2. GDM (gestational diabetes mellitus), class A1 - Patient did not bring her log today.  - States her fasting  glucose are 70's and PP are low-high 90's.  - CNM provided education on the importance of tracking glucose and dietary measures to keep numbers low.  - Recommended to keep track of numbers for fasting and all meals and bring log to next visit.   3. [redacted] weeks gestation of pregnancy   4. Swelling of both hands - Swelling relieved with rest and relaxation. Discussed that this is a normal discomfort of pregnancy.  - Discussed carpel tunnel versus unknown injury. Patient able to move thumb without pain and has good function.  - Discussed that it is likely a sprain and Encouraged to get a OTC splint to rest it.   Preterm labor symptoms and general obstetric precautions including but not limited to vaginal bleeding, contractions, leaking of fluid and fetal movement were reviewed in detail with the patient. Please refer to After Visit Summary for other counseling recommendations.   Return in about 2 weeks (around 06/02/2022) for LOB.  Future Appointments  Date Time Provider Department Center  05/26/2022  3:30 PM [facility] NURSE [facility]  05/26/2022  3:45 PM [facility] US6 [facility]  05/30/2022  9:30 AM Ane Payment L, PTA [facility] None  06/02/2022  9:55 AM Leftwich-Kirby, Wilmer Floor, CNM [facility] None  06/02/2022  2:15 PM [facility] NURSE [facility]  06/02/2022  2:30 PM [facility] US3 [facility]  06/04/2022 11:45 AM Beuhring, Johanna E, PT [facility] None  06/09/2022  9:55 AM Leftwich-Kirby, Wilmer Floor, CNM [facility] None  06/09/2022  2:15 PM [facility] NURSE [facility]  06/09/2022  2:30 PM [facility] US3 [facility]  06/10/2022 11:45 AM Beuhring, Johanna E, PT [facility] None  06/16/2022  9:55 AM Carlynn Herald, CNM [facility] None  06/16/2022 12:30 PM [facility] NURSE [facility]  06/16/2022 12:45 PM [facility] US5 [facility]  06/18/2022 11:00 AM Beuhring, Johanna E, PT [facility] None  06/23/2022  9:55 AM Constant, Gigi Gin, MD [facility] None  06/25/2022 11:00 AM Beuhring, Doyce Para, PT [facility] None  06/30/2022   9:55 AM Leftwich-Kirby, Wilmer Floor, CNM [facility] None  07/02/2022 12:30 PM Beuhring, Doyce Para, PT [facility] None    Cashay Manganelli Danella Deis) Suzie Portela, MSN, CNM  Center for [facility]  05/20/22 12:34 PM

## 2022-05-26 ENCOUNTER — Ambulatory Visit: Payer: BC Managed Care – PPO | Attending: Obstetrics

## 2022-05-26 ENCOUNTER — Encounter: Payer: Self-pay | Admitting: *Deleted

## 2022-05-26 ENCOUNTER — Ambulatory Visit: Payer: BC Managed Care – PPO | Admitting: *Deleted

## 2022-05-26 DIAGNOSIS — D563 Thalassemia minor: Secondary | ICD-10-CM

## 2022-05-26 DIAGNOSIS — Z3A34 34 weeks gestation of pregnancy: Secondary | ICD-10-CM

## 2022-05-26 DIAGNOSIS — O285 Abnormal chromosomal and genetic finding on antenatal screening of mother: Secondary | ICD-10-CM

## 2022-05-26 DIAGNOSIS — O24419 Gestational diabetes mellitus in pregnancy, unspecified control: Secondary | ICD-10-CM | POA: Insufficient documentation

## 2022-05-28 ENCOUNTER — Telehealth: Payer: Self-pay

## 2022-05-28 NOTE — Telephone Encounter (Signed)
Patient called stating that her job is requesting a letter stating that it is okay for her to return to work. Patient states that she did not go to work on 9/14 due to having braxton hicks contractions.  Okay per Dr. Elgie Congo to provide letter stating that it is okay for patient to return to work.  Reiterated to patient that she needs to be seen if she is missing work for pregnancy related issues in office or MAU. Patient verbalized understanding

## 2022-05-30 ENCOUNTER — Encounter: Payer: Self-pay | Admitting: Physical Therapy

## 2022-05-30 ENCOUNTER — Ambulatory Visit: Payer: BC Managed Care – PPO | Admitting: Physical Therapy

## 2022-05-30 DIAGNOSIS — R293 Abnormal posture: Secondary | ICD-10-CM

## 2022-05-30 DIAGNOSIS — M5459 Other low back pain: Secondary | ICD-10-CM

## 2022-05-30 DIAGNOSIS — M6281 Muscle weakness (generalized): Secondary | ICD-10-CM

## 2022-05-30 DIAGNOSIS — M419 Scoliosis, unspecified: Secondary | ICD-10-CM | POA: Diagnosis not present

## 2022-05-30 NOTE — Therapy (Signed)
OUTPATIENT PHYSICAL THERAPY TREATMENT NOTE   Patient Name: Meagan Richardson MRN: [id number] DOB:1995/12/28, 26 y.o., female Today's Date: 05/30/2022  PCP: [facility] REFERRING PROVIDER:  Seabron Spates, CNM   END OF SESSION:   PT End of Session - 05/30/22 0956     Visit Number 2    Date for PT Re-Evaluation 07/01/22    Authorization Type [employer]    Authorization Time Period requesting 1x/week x 6 weeks through 07/01/22    PT Start Time 0955   25 min late   PT Stop Time 1020    PT Time Calculation (min) 25 min    Activity Tolerance Patient tolerated treatment well    Behavior During Therapy [facility] for tasks assessed/performed             Past Medical History:  Diagnosis Date   Scoliosis    Past Surgical History:  Procedure Laterality Date   NO PAST SURGERIES     Patient Active Problem List   Diagnosis Date Noted   Gestational diabetes 04/19/2022   Alpha thalassemia silent carrier 01/28/2022   Supervision of normal first pregnancy, antepartum 01/07/2022   Scoliosis 01/07/2022   Shortness of breath 10/09/2016    REFERRING DIAG: M41.9 (ICD-10-CM) - Scoliosis of lumbar spine, unspecified scoliosis   THERAPY DIAG:  Other low back pain  Abnormal posture  Muscle weakness (generalized)  Rationale for Evaluation and Treatment Rehabilitation  PERTINENT HISTORY: 20w2dpregnant at evaluation on 05/20/22 Scoliosis New diagnosis of gestational diabetes, will be monitored  PRECAUTIONS: Other: pregnant, EDD 07/06/22  SUBJECTIVE: No pain  PAIN:  Are you having pain? No   OBJECTIVE: (objective measures completed at initial evaluation unless otherwise dated)  DIAGNOSTIC FINDINGS:  IMPRESSION THORACIC AND LUMBAR MRI 04/2022 1. Scoliosis. 2. No spinal canal stenosis or neural foraminal narrowing. 3. Transitional anatomy, with 4 lumbar-type vertebral bodies noted. Please correlate with imaging if any intervention is planned.    Segmentation: In keeping with the prior exam, there are 4 lumbar-type vertebral bodies, with the lowest well-formed disc space labeled L4-L5. Complete sacralization of L5.   LUMBAR: Alignment: Scoliosis, with levoscoliosis of the lumbar spine. Straightening of the normal lumbar lordosis. No listhesis.   THORACIC: Alignment: Scoliosis. Straightening of the normal thoracic kyphosis. No listhesis   PATIENT SURVEYS:  Modified Oswestry 16 = moderate disability    SCREENING FOR RED FLAGS: Bowel or bladder incontinence: Yes: pregnancy Spinal tumors: No Cauda equina syndrome: No Compression fracture: No Abdominal aneurysm: No   COGNITION:           Overall cognitive status: Within functional limits for tasks assessed                          SENSATION: WFL   MUSCLE LENGTH: Hamstrings: Right 60 deg; Left 60 deg     POSTURE: increased lumbar lordosis, decreased thoracic kyphosis, and scoliosis, Rt shoulder elevated   PALPATION: Tender bil SI joints, bil lumbar and paraspinals, Rt intrascapular muscles   LUMBAR ROM:    Active  A/PROM  eval  Flexion Fingers to mid shin, no pain  Extension 10, pain  Lt lateral flexion full  Left lateral flexion full  Right rotation 75%  Left rotation 75%   (Blank rows = not tested)   LOWER EXTREMITY ROM:      Active  Right eval Left eval  Hip flexion      Hip extension      Hip  abduction      Hip adduction      Hip internal rotation      Hip external rotation 50 60  Knee flexion      Knee extension      Ankle dorsiflexion      Ankle plantarflexion      Ankle inversion      Ankle eversion       (Blank rows = not tested)   LOWER EXTREMITY MMT:   4-/5 grossly bil LE       FUNCTIONAL TESTS:  Uses bil UE to rise from chair   GAIT: Distance walked: within clinic Assistive device utilized: None Level of assistance: Modified independence Comments: slow gait, lateral lean during stance phase bil       TODAY'S TREATMENT    05/30/22: Review of initial HEP: added trunk rotation to the RT: 3x 3 breaths Cat Camel 5x in quadruped Seated red band horizontal abd 10x, Ext Rotation 10x, diagonals 10x ach added to HEP, band and visuals given Self CAre: use of pillow or towel roll to support lumbar curve, how to use pillows to support UE for feeding and holding baby. PTA demo, pt verbally understood concepts. Finding the TA muscle in quadruped:; pt could do properly and will try using at work.   05/20/22: Discussion of pregnancy and posture changes Initiated HEP Check all possible CPT codes: 97110- Therapeutic Exercise                           If treatment provided at initial evaluation, no treatment charged due to lack of authorization.                            PATIENT EDUCATION:  Education details: Access Code: [email or web address] Person educated: Patient Education method: Consulting civil engineer, Demonstration, Verbal cues, and Handouts Education comprehension: verbalized understanding and returned demonstration     HOME EXERCISE PROGRAM: Access Code: [email or web address] URL: [email or web address] Date: 05/20/2022 Prepared by: Venetia Night Beuhring   Exercises - Seated Hip External Rotation Stretch  - 3 x daily - 7 x weekly - 1 sets - 2 reps - 30 hold - Seated Piriformis Stretch  - 3 x daily - 7 x weekly - 1 sets - 2 reps - 30 hold - Quadruped Rocking Backward  - 3 x daily - 7 x weekly - 1 sets - 20 reps - Sidelying Open Book Thoracic Rotation with Knee on Foam Roll  - 3 x daily - 7 x weekly - 1 sets - 10 reps Added Myrene Galas, PTA 05/30/22 10:32 AM red band scap unattached and quadruped strtech with Lt sidebend   ASSESSMENT:   CLINICAL IMPRESSION: Pt 25 min late for todays appt.   Pt independent and compliant with initial HEP. Pt prefers the quadruped positions vs the seated exercises. Added some upper back tband exercises to progress HEP. Pt had no pain with exercises. Pt was educated in lumbar support with sitting  and how to support her UE when feeding/holding the baby. STGs met.    OBJECTIVE IMPAIRMENTS Abnormal gait, decreased activity tolerance, decreased ROM, decreased strength, increased muscle spasms, impaired flexibility, postural dysfunction, and pain.    ACTIVITY LIMITATIONS carrying, lifting, bending, sitting, standing, squatting, sleeping, transfers, bed mobility, and locomotion level   PARTICIPATION LIMITATIONS: meal prep, cleaning, laundry, shopping, community activity, and occupation   PERSONAL FACTORS 1 comorbidity: gestational diabetes  are also affecting patient's functional outcome.  REHAB POTENTIAL: Good   CLINICAL DECISION MAKING: Stable/uncomplicated   EVALUATION COMPLEXITY: Low     GOALS: Goals reviewed with patient? Yes   SHORT TERM GOALS: Target date: 06/10/22   Pt will be educated on positioning for symptom management and be ind with initial HEP Baseline: Goal status: Goal met 05/30/22   2.  Pt will learn how to properly recruit deep TA for lumbar support with functional movement. Baseline:  Goal status: goal met 05/30/22       LONG TERM GOALS: Target date: 07/01/22   Pt will be ind with advanced HEP and understand how to best manage symptoms as pregnancy progresses toward delivery planning. Baseline:  Goal status: INITIAL   2.  Pt will be educated on positional options for labor and how they relate to stages of labor. Baseline:  Goal status: INITIAL   3.  Pt will report reduced back pain by at least 30% Baseline:  Goal status: INITIAL   4.  Pt will score 14 or less on ODI to demo reduced disability (from moderate to mild) Baseline: 16 Goal status: INITIAL         PLAN: PT FREQUENCY: 1x/week   PT DURATION: 6 weeks   PLANNED INTERVENTIONS: Therapeutic exercises, Therapeutic activity, Neuromuscular re-education, Patient/Family education, Self Care, Joint mobilization, Electrical stimulation, Spinal mobilization, Moist heat, Taping, and Manual  therapy.   PLAN FOR NEXT SESSION:  Review band ex, see how pt is doing.      Lizzett Nobile, PTA 05/30/2022, 10:32 AM

## 2022-06-02 ENCOUNTER — Ambulatory Visit: Payer: BC Managed Care – PPO | Attending: Obstetrics

## 2022-06-02 ENCOUNTER — Encounter: Payer: Self-pay | Admitting: *Deleted

## 2022-06-02 ENCOUNTER — Ambulatory Visit: Payer: BC Managed Care – PPO | Admitting: *Deleted

## 2022-06-02 ENCOUNTER — Encounter: Payer: Self-pay | Admitting: Advanced Practice Midwife

## 2022-06-02 ENCOUNTER — Ambulatory Visit (INDEPENDENT_AMBULATORY_CARE_PROVIDER_SITE_OTHER): Payer: BC Managed Care – PPO | Admitting: Advanced Practice Midwife

## 2022-06-02 VITALS — BP 128/79 | HR 85 | Wt 146.7 lb

## 2022-06-02 DIAGNOSIS — O285 Abnormal chromosomal and genetic finding on antenatal screening of mother: Secondary | ICD-10-CM | POA: Diagnosis not present

## 2022-06-02 DIAGNOSIS — O24410 Gestational diabetes mellitus in pregnancy, diet controlled: Secondary | ICD-10-CM

## 2022-06-02 DIAGNOSIS — Z3A35 35 weeks gestation of pregnancy: Secondary | ICD-10-CM | POA: Diagnosis not present

## 2022-06-02 DIAGNOSIS — O0993 Supervision of high risk pregnancy, unspecified, third trimester: Secondary | ICD-10-CM

## 2022-06-02 DIAGNOSIS — O24419 Gestational diabetes mellitus in pregnancy, unspecified control: Secondary | ICD-10-CM | POA: Diagnosis not present

## 2022-06-02 DIAGNOSIS — D563 Thalassemia minor: Secondary | ICD-10-CM

## 2022-06-02 DIAGNOSIS — R109 Unspecified abdominal pain: Secondary | ICD-10-CM

## 2022-06-02 DIAGNOSIS — O26893 Other specified pregnancy related conditions, third trimester: Secondary | ICD-10-CM

## 2022-06-02 DIAGNOSIS — M419 Scoliosis, unspecified: Secondary | ICD-10-CM

## 2022-06-02 NOTE — Progress Notes (Signed)
PRENATAL VISIT NOTE  Subjective:  Meagan Richardson is a 26 y.o. G1P0 at [redacted]w[redacted]d being seen today for ongoing prenatal care.  She is currently monitored for the following issues for this high-risk pregnancy and has Shortness of breath; Supervision of normal first pregnancy, antepartum; Scoliosis; Alpha thalassemia silent carrier; and Gestational diabetes on their problem list.  Patient reports occasional contractions.  Contractions: Irritability. Vag. Bleeding: None.  Movement: Present. Denies leaking of fluid.   The following portions of the patient's history were reviewed and updated as appropriate: allergies, current medications, past family history, past medical history, past social history, past surgical history and problem list.   Objective:   Vitals:   06/02/22 1001  BP: 128/79  Pulse: 85  Weight: 146 lb 11.2 oz (66.5 kg)    Fetal Status: Fetal Heart Rate (bpm): 141 Fundal Height: 36 cm Movement: Present     General:  Alert, oriented and cooperative. Patient is in no acute distress.  Skin: Skin is warm and dry. No rash noted.   Cardiovascular: Normal heart rate noted  Respiratory: Normal respiratory effort, no problems with respiration noted  Abdomen: Soft, gravid, appropriate for gestational age.  Pain/Pressure: Absent     Pelvic: Cervical exam deferred        Extremities: Normal range of motion.  Edema: Trace  Mental Status: Normal mood and affect. Normal behavior. Normal judgment and thought content.   Assessment and Plan:  Pregnancy: G1P0 at [redacted]w[redacted]d 1. Supervision of high risk pregnancy in third trimester --Anticipatory guidance about next visits/weeks of pregnancy given.   2. GDM (gestational diabetes mellitus), class A1 --Pt does not have glucose log or meter but reports she is taking blood sugars regularly.  After meals values are mostly within range but a few fasting values are in low 100s. --Discussed medications vs diet changes, pt to make diet changes this week,  follow up with log and review of all glucose values next week --EFW 77% on 05/19/22 --If glucose well controlled, IOL at 38-39 weeks, but IOL at 37 if not well controlled.   3. [redacted] weeks gestation of pregnancy   4. Scoliosis of lumbar spine, unspecified scoliosis type --Pt seeing PT which is helping  5. 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   --Letter provided today for pt to remain on light duty and not to perform task of assisting pt to shower.  Also limited pt hours to four 8 hours workdays per week for the pregnancy --Pt to check with HR and bring FMLA paperwork to our office ASAP  Preterm labor symptoms and general obstetric precautions including but not limited to vaginal bleeding, contractions, leaking of fluid and fetal movement were reviewed in detail with the patient. Please refer to After Visit Summary for other counseling recommendations.   Return in about 1 week (around 06/09/2022) for LOB, As scheduled.  Future Appointments  Date Time Provider [place]  06/02/2022  2:15 PM [facility] NURSE [facility]  06/02/2022  2:30 PM [facility] US3 [facility]  06/04/2022 11:45 AM Beuhring, Johanna E, PT [facility] None  06/09/2022  9:55 AM Leftwich-Kirby, Kathie Dike, CNM [facility] None  06/09/2022  2:15 PM [facility] NURSE [facility]  06/09/2022  2:30 PM [facility] US3 [facility]  06/10/2022 11:45 AM Beuhring, Johanna E, PT [facility] None  06/16/2022  9:55 AM Deloris Ping, CNM [facility] None  06/16/2022 12:30 PM [facility] NURSE [facility]  06/16/2022 12:45 PM [facility] US5 [facility]  06/18/2022 11:00 AM Beuhring, Johanna E, PT [facility] None  06/23/2022  9:55 AM Constant, Vickii Chafe, MD [facility] None  06/25/2022 11:00 AM Beuhring, Johanna E, PT [facility] None   06/30/2022  9:55 AM Leftwich-Kirby, Kathie Dike, CNM [facility] None  07/02/2022 12:30 PM Takacs, Craig Guess, PT [facility] None    Fatima Blank, CNM

## 2022-06-02 NOTE — Progress Notes (Signed)
Pt presents for ROB visit. Pt requesting to start maternity leave as soon as possible and requesting to be out through January.

## 2022-06-04 ENCOUNTER — Ambulatory Visit: Payer: BC Managed Care – PPO | Admitting: Physical Therapy

## 2022-06-04 ENCOUNTER — Encounter: Payer: Self-pay | Admitting: Physical Therapy

## 2022-06-04 DIAGNOSIS — R293 Abnormal posture: Secondary | ICD-10-CM

## 2022-06-04 DIAGNOSIS — M419 Scoliosis, unspecified: Secondary | ICD-10-CM | POA: Diagnosis not present

## 2022-06-04 DIAGNOSIS — M6281 Muscle weakness (generalized): Secondary | ICD-10-CM

## 2022-06-04 DIAGNOSIS — M5459 Other low back pain: Secondary | ICD-10-CM

## 2022-06-04 NOTE — Therapy (Addendum)
OUTPATIENT PHYSICAL THERAPY TREATMENT NOTE   Patient Name: Meagan Richardson MRN: [id number] DOB:[birth year], 26 y.o., female Today's Date: 06/04/2022  PCP: [facility] REFERRING PROVIDER:  Seabron Spates, CNM   END OF SESSION:   PT End of Session - 06/04/22 1154     Visit Number 3    Date for PT Re-Evaluation 07/01/22    Authorization Type [employer]    Authorization Time Period [employer] approved 6 visits 05/22/22 - 11/19/22    Authorization - Visit Number 2    Authorization - Number of Visits 6    PT Start Time 1155   Pt late   PT Stop Time 1230    PT Time Calculation (min) 35 min    Activity Tolerance Patient tolerated treatment well    Behavior During Therapy [facility] for tasks assessed/performed              Past Medical History:  Diagnosis Date   Scoliosis    Past Surgical History:  Procedure Laterality Date   NO PAST SURGERIES     Patient Active Problem List   Diagnosis Date Noted   Gestational diabetes 04/19/2022   Alpha thalassemia silent carrier 01/28/2022   Supervision of normal first pregnancy, antepartum 01/07/2022   Scoliosis 01/07/2022   Shortness of breath 10/09/2016    REFERRING DIAG: M41.9 (ICD-10-CM) - Scoliosis of lumbar spine, unspecified scoliosis   THERAPY DIAG:  Other low back pain  Abnormal posture  Muscle weakness (generalized)  Rationale for Evaluation and Treatment Rehabilitation  PERTINENT HISTORY: 54w2dpregnant at evaluation on 05/20/22 Scoliosis New diagnosis of gestational diabetes, will be monitored  PRECAUTIONS: Other: pregnant, EDD 07/06/22  SUBJECTIVE: Some low back pain.  The stretches feel good.  I have been having Braxton-Hicks contractions.  Doctor may induce me as early as 37 weeks if my blood sugar isn't well controlled.  PAIN:  Are you having pain? Yes, 3-4/10   OBJECTIVE: (objective measures completed at initial evaluation unless otherwise dated)  DIAGNOSTIC FINDINGS:  IMPRESSION  THORACIC AND LUMBAR MRI 04/2022 1. Scoliosis. 2. No spinal canal stenosis or neural foraminal narrowing. 3. Transitional anatomy, with 4 lumbar-type vertebral bodies noted. Please correlate with imaging if any intervention is planned.   Segmentation: In keeping with the prior exam, there are 4 lumbar-type vertebral bodies, with the lowest well-formed disc space labeled L4-L5. Complete sacralization of L5.   LUMBAR: Alignment: Scoliosis, with levoscoliosis of the lumbar spine. Straightening of the normal lumbar lordosis. No listhesis.   THORACIC: Alignment: Scoliosis. Straightening of the normal thoracic kyphosis. No listhesis   PATIENT SURVEYS:  Modified Oswestry 16 = moderate disability    SCREENING FOR RED FLAGS: Bowel or bladder incontinence: Yes: pregnancy Spinal tumors: No Cauda equina syndrome: No Compression fracture: No Abdominal aneurysm: No   COGNITION:           Overall cognitive status: Within functional limits for tasks assessed                          SENSATION: WFL   MUSCLE LENGTH: Hamstrings: Right 60 deg; Left 60 deg     POSTURE: increased lumbar lordosis, decreased thoracic kyphosis, and scoliosis, Rt shoulder elevated   PALPATION: Tender bil SI joints, bil lumbar and paraspinals, Rt intrascapular muscles   LUMBAR ROM:    Active  A/PROM  eval  Flexion Fingers to mid shin, no pain  Extension 10, pain  Lt lateral flexion full  Left lateral  flexion full  Right rotation 75%  Left rotation 75%   (Blank rows = not tested)   LOWER EXTREMITY ROM:      Active  Right eval Left eval  Hip flexion      Hip extension      Hip abduction      Hip adduction      Hip internal rotation      Hip external rotation 50 60  Knee flexion      Knee extension      Ankle dorsiflexion      Ankle plantarflexion      Ankle inversion      Ankle eversion       (Blank rows = not tested)   LOWER EXTREMITY MMT:   4-/5 grossly bil LE       FUNCTIONAL TESTS:   Uses bil UE to rise from chair   GAIT: Distance walked: within clinic Assistive device utilized: None Level of assistance: Modified independence Comments: slow gait, lateral lean during stance phase bil       TODAY'S TREATMENT  06/04/22: SL trunk rotation open book leg on peanut bolster 10x in hip IR and 10x in hip ER Standing draped over large blue physioball rock trunk side to side x 2' Standing foot on stool flexion/rotation with extension/rotation x 10 each side Seated trunk flexion in bil hip ER hands on blue physioball Quadruped rocking two ways: hips in IR and hips in ER x 10 each Cat/camel x 10 Child's pose with SB bil alt x 5 each Seated in chair red tband review x 10 each: horiz abd, ER, diagonals  05/30/22: Review of initial HEP: added trunk rotation to the RT: 3x 3 breaths Cat Camel 5x in quadruped Seated red band horizontal abd 10x, Ext Rotation 10x, diagonals 10x ach added to HEP, band and visuals given Self CAre: use of pillow or towel roll to support lumbar curve, how to use pillows to support UE for feeding and holding baby. PTA demo, pt verbally understood concepts. Finding the TA muscle in quadruped:; pt could do properly and will try using at work.   05/20/22: Discussion of pregnancy and posture changes Initiated HEP Check all possible CPT codes: 97110- Therapeutic Exercise                           If treatment provided at initial evaluation, no treatment charged due to lack of authorization.                            PATIENT EDUCATION:  Education details: Access Code: [email or web address] Person educated: Patient Education method: Consulting civil engineer, Demonstration, Verbal cues, and Handouts Education comprehension: verbalized understanding and returned demonstration     HOME EXERCISE PROGRAM: Access Code: [email or web address] URL: [email or web address] Date: 05/20/2022 Prepared by: Venetia Night Jobani Sabado   Exercises - Seated Hip External Rotation Stretch  - 3 x daily  - 7 x weekly - 1 sets - 2 reps - 30 hold - Seated Piriformis Stretch  - 3 x daily - 7 x weekly - 1 sets - 2 reps - 30 hold - Quadruped Rocking Backward  - 3 x daily - 7 x weekly - 1 sets - 20 reps - Sidelying Open Book Thoracic Rotation with Knee on Foam Roll  - 3 x daily - 7 x weekly - 1 sets - 10 reps Added Myrene Galas, PTA 06/04/22 12:40 PM red band scap unattached  and quadruped strtech with Lt sidebend   ASSESSMENT:   CLINICAL IMPRESSION: Pt arrived with some mild/mod LBP. She is now 5w3dpregnant and may be induced as early as 37 weeks if blood sugar isn't well controlled.  Session focused on mobility for labor, stretching, and decompression.  Pt reported signif relief and enjoyment of positioning and mobility today.  Educated on how to perform at home with modifications given she doesn't have a physioball, etc.     OBJECTIVE IMPAIRMENTS Abnormal gait, decreased activity tolerance, decreased ROM, decreased strength, increased muscle spasms, impaired flexibility, postural dysfunction, and pain.    ACTIVITY LIMITATIONS carrying, lifting, bending, sitting, standing, squatting, sleeping, transfers, bed mobility, and locomotion level   PARTICIPATION LIMITATIONS: meal prep, cleaning, laundry, shopping, community activity, and occupation   PERSONAL FACTORS 1 comorbidity: gestational diabetes  are also affecting patient's functional outcome.    REHAB POTENTIAL: Good   CLINICAL DECISION MAKING: Stable/uncomplicated   EVALUATION COMPLEXITY: Low     GOALS: Goals reviewed with patient? Yes   SHORT TERM GOALS: Target date: 06/10/22   Pt will be educated on positioning for symptom management and be ind with initial HEP Baseline: Goal status: Goal met 05/30/22   2.  Pt will learn how to properly recruit deep TA for lumbar support with functional movement. Baseline:  Goal status: goal met 05/30/22       LONG TERM GOALS: Target date: 07/01/22   Pt will be ind with advanced HEP and  understand how to best manage symptoms as pregnancy progresses toward delivery planning. Baseline:  Goal status: ongoing   2.  Pt will be educated on positional options for labor and how they relate to stages of labor. Baseline:  Goal status: met   3.  Pt will report reduced back pain by at least 30% Baseline:  Goal status: INITIAL   4.  Pt will score 14 or less on ODI to demo reduced disability (from moderate to mild) Baseline: 16 Goal status: INITIAL         PLAN: PT FREQUENCY: 1x/week   PT DURATION: 6 weeks   PLANNED INTERVENTIONS: Therapeutic exercises, Therapeutic activity, Neuromuscular re-education, Patient/Family education, Self Care, Joint mobilization, Electrical stimulation, Spinal mobilization, Moist heat, Taping, and Manual therapy.   PLAN FOR NEXT SESSION:  continue mobility, stretching, labor positioning prep, postural strength  PHYSICAL THERAPY DISCHARGE SUMMARY  Visits from Start of Care: 3  Current functional level related to goals / functional outcomes: Pt was admitted for induction and has delivered her baby.  D/C with return for post-partum PT as needed   Remaining deficits: See above   Education / Equipment: HEP   Patient agrees to discharge. Patient goals were partially met. Patient is being discharged due to a change in medical status.  Mirtha Jain, PT 06/18/22 11:53 AM    JBaruch Merl PT 06/04/22 12:40 PM

## 2022-06-09 ENCOUNTER — Ambulatory Visit: Payer: Medicaid Other | Attending: Obstetrics

## 2022-06-09 ENCOUNTER — Ambulatory Visit: Payer: Medicaid Other | Admitting: *Deleted

## 2022-06-09 ENCOUNTER — Ambulatory Visit (INDEPENDENT_AMBULATORY_CARE_PROVIDER_SITE_OTHER): Payer: Medicaid Other | Admitting: Advanced Practice Midwife

## 2022-06-09 ENCOUNTER — Other Ambulatory Visit (HOSPITAL_COMMUNITY)
Admission: RE | Admit: 2022-06-09 | Discharge: 2022-06-09 | Disposition: A | Discharge status: Home / Self Care | Payer: Medicaid Other | Source: Ambulatory Visit | Attending: Advanced Practice Midwife | Admitting: Advanced Practice Midwife

## 2022-06-09 ENCOUNTER — Encounter: Payer: Self-pay | Admitting: Advanced Practice Midwife

## 2022-06-09 ENCOUNTER — Encounter: Payer: Self-pay | Admitting: *Deleted

## 2022-06-09 VITALS — BP 127/83 | HR 78 | Wt 148.7 lb

## 2022-06-09 DIAGNOSIS — D573 Sickle-cell trait: Secondary | ICD-10-CM

## 2022-06-09 DIAGNOSIS — O24410 Gestational diabetes mellitus in pregnancy, diet controlled: Secondary | ICD-10-CM

## 2022-06-09 DIAGNOSIS — O0993 Supervision of high risk pregnancy, unspecified, third trimester: Secondary | ICD-10-CM | POA: Insufficient documentation

## 2022-06-09 DIAGNOSIS — D563 Thalassemia minor: Secondary | ICD-10-CM

## 2022-06-09 DIAGNOSIS — Z3A36 36 weeks gestation of pregnancy: Secondary | ICD-10-CM

## 2022-06-09 DIAGNOSIS — O285 Abnormal chromosomal and genetic finding on antenatal screening of mother: Secondary | ICD-10-CM | POA: Diagnosis not present

## 2022-06-09 DIAGNOSIS — Z3400 Encounter for supervision of normal first pregnancy, unspecified trimester: Secondary | ICD-10-CM

## 2022-06-09 DIAGNOSIS — O24419 Gestational diabetes mellitus in pregnancy, unspecified control: Secondary | ICD-10-CM | POA: Insufficient documentation

## 2022-06-09 DIAGNOSIS — M419 Scoliosis, unspecified: Secondary | ICD-10-CM

## 2022-06-09 NOTE — Patient Instructions (Signed)
Labor Precautions Reasons to come to MAU at [facility]:  1.  Contractions are  5 minutes apart or less, each last 1 minute, these have been going on for 1-2 hours, and you cannot walk or talk during them 2.  You have a large gush of fluid, or a trickle of fluid that will not stop and you have to wear a pad 3.  You have bleeding that is bright red, heavier than spotting--like menstrual bleeding (spotting can be normal in early labor or after a check of your cervix) 4.  You do not feel the baby moving like he/she normally does  

## 2022-06-09 NOTE — Progress Notes (Signed)
PRENATAL VISIT NOTE  Subjective:  Meagan Richardson is a 26 y.o. G1P0 at [redacted]w[redacted]d being seen today for ongoing prenatal care.  She is currently monitored for the following issues for this high-risk pregnancy and has Shortness of breath; Supervision of normal first pregnancy, antepartum; Scoliosis; Alpha thalassemia silent carrier; and Gestational diabetes on their problem list.  Patient reports occasional contractions.  Contractions: Irritability. Vag. Bleeding: None.  Movement: Present. Denies leaking of fluid.   The following portions of the patient's history were reviewed and updated as appropriate: allergies, current medications, past family history, past medical history, past social history, past surgical history and problem list.   Objective:   Vitals:   06/09/22 0955  BP: 127/83  Pulse: 78  Weight: 148 lb 11.2 oz (67.4 kg)    Fetal Status: Fetal Heart Rate (bpm): 136 Fundal Height: 38 cm Movement: Present  Presentation: Vertex  General:  Alert, oriented and cooperative. Patient is in no acute distress.  Skin: Skin is warm and dry. No rash noted.   Cardiovascular: Normal heart rate noted  Respiratory: Normal respiratory effort, no problems with respiration noted  Abdomen: Soft, gravid, appropriate for gestational age.  Pain/Pressure: Present     Pelvic: Cervical exam performed in the presence of a chaperone Dilation: 1.5 Effacement (%): 70 Station: -2  Extremities: Normal range of motion.  Edema: Trace  Mental Status: Normal mood and affect. Normal behavior. Normal judgment and thought content.   Assessment and Plan:  Pregnancy: G1P0 at [redacted]w[redacted]d 1. Supervision of high risk pregnancy in third trimester --Anticipatory guidance about next visits/weeks of pregnancy given.  --Pt reports a few hours yesterday with less fetal movement than usual, but baby started moving before she went to sleep last night around midnight and has continued to move normally today per pt --Irregular  contractions, reviewed signs of labor/reasons to go to MAU  2. GDM (gestational diabetes mellitus), class A1 --Pt with few glucose values to review last visit.  MFM discussed early IOL if unsure of glucose control.  Pt counseled to keep log of fasting and PP glucose and recommended diet reviewed at last visit.  -- Today pt brought her glucose meter to review.  Although she does not have 4 glucose values daily, she has several fasting and several postprandial values since her last visit.   --Out of 4 fasting values, one was 95, all others below 90 --Out of 23 postprandial values, 6 were elevated, 2 were 121 and highest was 146 --Glucose appears to be well controlled with diet --Pt encouraged to take glucose fasting and 2 hours after meals daily and bring meter or log to appts --[place] with MFM today for growth   3. Scoliosis of lumbar spine, unspecified scoliosis type --Pt in PT which is helping  4. [redacted] weeks gestation of pregnancy   Term labor symptoms and general obstetric precautions including but not limited to vaginal bleeding, contractions, leaking of fluid and fetal movement were reviewed in detail with the patient. Please refer to After Visit Summary for other counseling recommendations.   Return in about 1 week (around 06/16/2022) for Any provider, As scheduled.  Future Appointments  Date Time Provider [place]  06/09/2022  2:15 PM [facility] NURSE [facility]  06/09/2022  2:30 PM [facility] US3 [facility]  06/10/2022 11:45 AM Beuhring, Johanna E, PT [facility] None  06/16/2022  9:55 AM Deloris Ping, CNM [facility] None  06/16/2022 12:30 PM [facility] NURSE [facility]  06/16/2022 12:45 PM [facility] US5  [facility]  06/18/2022 11:00 AM Beuhring, Johanna E, PT [facility] None  06/23/2022  9:55 AM Constant, Vickii Chafe, MD [facility] None  06/25/2022 11:00 AM Beuhring, Johanna E, PT [facility] None  06/30/2022  9:55 AM Leftwich-Kirby, Kathie Dike, CNM [facility] None  07/02/2022 12:30 PM Takacs,  Craig Guess, PT [facility] None    Fatima Blank, CNM

## 2022-06-10 ENCOUNTER — Telehealth: Payer: Self-pay | Admitting: Physical Therapy

## 2022-06-10 ENCOUNTER — Ambulatory Visit: Payer: Medicaid Other | Attending: Advanced Practice Midwife | Admitting: Physical Therapy

## 2022-06-10 DIAGNOSIS — M419 Scoliosis, unspecified: Secondary | ICD-10-CM | POA: Insufficient documentation

## 2022-06-10 DIAGNOSIS — M5459 Other low back pain: Secondary | ICD-10-CM | POA: Insufficient documentation

## 2022-06-10 DIAGNOSIS — R293 Abnormal posture: Secondary | ICD-10-CM | POA: Insufficient documentation

## 2022-06-10 DIAGNOSIS — M6281 Muscle weakness (generalized): Secondary | ICD-10-CM | POA: Insufficient documentation

## 2022-06-10 LAB — CERVICOVAGINAL ANCILLARY ONLY
Chlamydia: NEGATIVE
Comment: NEGATIVE
Comment: NEGATIVE
Comment: NORMAL
Neisseria Gonorrhea: NEGATIVE
Trichomonas: NEGATIVE

## 2022-06-10 NOTE — Telephone Encounter (Signed)
Pt was a no show for PT appt today 10/3 at 11:45am.  Tried to reach her but no answer or voice mail opportunity to leave a message.    Emilene Roma, PT 06/10/22 12:09 PM

## 2022-06-11 LAB — STREP GP B NAA: Strep Gp B NAA: NEGATIVE

## 2022-06-16 ENCOUNTER — Encounter: Payer: Self-pay | Admitting: Certified Nurse Midwife

## 2022-06-16 ENCOUNTER — Ambulatory Visit: Payer: Medicaid Other | Admitting: *Deleted

## 2022-06-16 ENCOUNTER — Telehealth (HOSPITAL_COMMUNITY): Payer: Self-pay | Admitting: *Deleted

## 2022-06-16 ENCOUNTER — Ambulatory Visit (INDEPENDENT_AMBULATORY_CARE_PROVIDER_SITE_OTHER): Payer: Medicaid Other | Admitting: Certified Nurse Midwife

## 2022-06-16 ENCOUNTER — Ambulatory Visit (HOSPITAL_BASED_OUTPATIENT_CLINIC_OR_DEPARTMENT_OTHER): Payer: Medicaid Other

## 2022-06-16 ENCOUNTER — Encounter (HOSPITAL_COMMUNITY): Payer: Self-pay | Admitting: *Deleted

## 2022-06-16 VITALS — BP 132/83 | HR 74 | Wt 149.2 lb

## 2022-06-16 VITALS — BP 128/70 | HR 81

## 2022-06-16 DIAGNOSIS — D573 Sickle-cell trait: Secondary | ICD-10-CM | POA: Diagnosis not present

## 2022-06-16 DIAGNOSIS — O285 Abnormal chromosomal and genetic finding on antenatal screening of mother: Secondary | ICD-10-CM | POA: Diagnosis not present

## 2022-06-16 DIAGNOSIS — Z3A37 37 weeks gestation of pregnancy: Secondary | ICD-10-CM

## 2022-06-16 DIAGNOSIS — O24410 Gestational diabetes mellitus in pregnancy, diet controlled: Secondary | ICD-10-CM | POA: Diagnosis not present

## 2022-06-16 DIAGNOSIS — O24419 Gestational diabetes mellitus in pregnancy, unspecified control: Secondary | ICD-10-CM

## 2022-06-16 DIAGNOSIS — D563 Thalassemia minor: Secondary | ICD-10-CM

## 2022-06-16 DIAGNOSIS — O0993 Supervision of high risk pregnancy, unspecified, third trimester: Secondary | ICD-10-CM

## 2022-06-16 NOTE — Progress Notes (Signed)
Patient presents for [place] visit. Pt asking about induction. No other concerns at this time.

## 2022-06-16 NOTE — Telephone Encounter (Signed)
Preadmission screen  

## 2022-06-16 NOTE — Progress Notes (Signed)
PRENATAL VISIT NOTE  Subjective:  Meagan Richardson is a 26 y.o. G1P0 at [redacted]w[redacted]d being seen today for ongoing prenatal care.  She is currently monitored for the following issues for this high-risk pregnancy and has Shortness of breath; Supervision of normal first pregnancy, antepartum; Scoliosis; Alpha thalassemia silent carrier; and Gestational diabetes on their problem list.  Patient reports contractions since 1 week ago. Patient states that they are irregular every 10-12 mins last 3-4 mins. Patient states that they are starting to wake her up out of her sleep at night. But endorses that they are not consistent.  .  Contractions: Irregular. Vag. Bleeding: None.  Movement: Present. Denies leaking of fluid.   The following portions of the patient's history were reviewed and updated as appropriate: allergies, current medications, past family history, past medical history, past social history, past surgical history and problem list.   Objective:   Vitals:   06/16/22 1001  BP: 132/83  Pulse: 74  Weight: 149 lb 3.2 oz (67.7 kg)    Fetal Status: Fetal Heart Rate (bpm): 143 Fundal Height: 37 cm Movement: Present     General:  Alert, oriented and cooperative. Patient is in no acute distress.  Skin: Skin is warm and dry. No rash noted.   Cardiovascular: Normal heart rate noted  Respiratory: Normal respiratory effort, no problems with respiration noted  Abdomen: Soft, gravid, appropriate for gestational age.  Pain/Pressure: Present     Pelvic: Cervical exam deferred        Extremities: Normal range of motion.  Edema: Trace  Mental Status: Normal mood and affect. Normal behavior. Normal judgment and thought content.   Assessment and Plan:  Pregnancy: G1P0 at [redacted]w[redacted]d 1. Supervision of high risk pregnancy in third trimester - Patient feeling frequent and vigorous fetal movement.  - Discussed braxton Hick contractions and reviewed signs and symptoms of labor.  - Encouraged to report to MAU if  s/s of labor arise.   2. GDM (gestational diabetes mellitus), class A1 - Patient not measuring CBC daily or appropriately. Numbers since 10/4  - Fasting range 73-114 with 1 outlier - Post meal: 84-148 (#'s being measured randomly post meal) with 3 outliers >130.  - 4 outliers out of 11 readings  - Consulted Dr. Rosendo Gros, MD on scheduling for this week or next based on current readings. Per MD attempt to schedule for this week.   - Per MFM recommendation and poorly controlled GDM IOL scheduled for 06/17/22.   3. [redacted] weeks gestation of pregnancy - BPP with MF scheduled for today,  - Patient declines cervical exam today.  - Based on previous SVE from 06/09/22 (1.5cm/70/-2) L. LK CNM plan to initiate IOL with Cytotec, possibly FB.   Preterm labor symptoms and general obstetric precautions including but not limited to vaginal bleeding, contractions, leaking of fluid and fetal movement were reviewed in detail with the patient. Please refer to After Visit Summary for other counseling recommendations.   Return in about 1 week (around 06/23/2022) for HROB. If still pregnant at that time.   Future Appointments  Date Time Provider [place]  06/16/2022 12:30 PM [facility] NURSE [facility]  06/16/2022 12:45 PM [facility] US5 [facility]  06/17/2022  7:30 AM [facility] SCHED ROOM [facility] None  06/18/2022 11:00 AM Beuhring, Johanna E, PT [facility] None  06/23/2022  9:55 AM Constant, Vickii Chafe, MD [place] None  06/25/2022 11:00 AM Beuhring, Johanna E, PT [facility] None  06/30/2022  9:55 AM Leftwich-Kirby, Kathie Dike, CNM [facility]  None  07/02/2022 12:30 PM Takacs, Craig Guess, PT [facility] None    Adriena Manfre (Isaias Sakai) Rollene Rotunda, MSN, [place] for [facility]  06/16/22 11:20 AM

## 2022-06-17 ENCOUNTER — Inpatient Hospital Stay (HOSPITAL_COMMUNITY): Payer: Medicaid Other

## 2022-06-17 ENCOUNTER — Encounter (HOSPITAL_COMMUNITY): Payer: Self-pay | Admitting: Family Medicine

## 2022-06-17 ENCOUNTER — Inpatient Hospital Stay (HOSPITAL_COMMUNITY): Payer: Medicaid Other | Admitting: Anesthesiology

## 2022-06-17 ENCOUNTER — Other Ambulatory Visit: Payer: Self-pay

## 2022-06-17 ENCOUNTER — Inpatient Hospital Stay (HOSPITAL_COMMUNITY)
Admission: RE | Admit: 2022-06-17 | Discharge: 2022-06-19 | DRG: 807 | Disposition: A | Discharge status: Home / Self Care | Payer: Medicaid Other | Attending: Family Medicine | Admitting: Family Medicine

## 2022-06-17 DIAGNOSIS — D563 Thalassemia minor: Secondary | ICD-10-CM | POA: Diagnosis present

## 2022-06-17 DIAGNOSIS — Z3490 Encounter for supervision of normal pregnancy, unspecified, unspecified trimester: Principal | ICD-10-CM | POA: Diagnosis present

## 2022-06-17 DIAGNOSIS — Z3A37 37 weeks gestation of pregnancy: Secondary | ICD-10-CM | POA: Diagnosis not present

## 2022-06-17 DIAGNOSIS — O24420 Gestational diabetes mellitus in childbirth, diet controlled: Principal | ICD-10-CM | POA: Diagnosis present

## 2022-06-17 DIAGNOSIS — O99354 Diseases of the nervous system complicating childbirth: Secondary | ICD-10-CM | POA: Diagnosis present

## 2022-06-17 DIAGNOSIS — Z23 Encounter for immunization: Secondary | ICD-10-CM

## 2022-06-17 DIAGNOSIS — Z30017 Encounter for initial prescription of implantable subdermal contraceptive: Secondary | ICD-10-CM | POA: Diagnosis not present

## 2022-06-17 DIAGNOSIS — M419 Scoliosis, unspecified: Secondary | ICD-10-CM | POA: Diagnosis present

## 2022-06-17 DIAGNOSIS — O9902 Anemia complicating childbirth: Secondary | ICD-10-CM | POA: Diagnosis not present

## 2022-06-17 DIAGNOSIS — Z3400 Encounter for supervision of normal first pregnancy, unspecified trimester: Secondary | ICD-10-CM

## 2022-06-17 DIAGNOSIS — D649 Anemia, unspecified: Secondary | ICD-10-CM | POA: Diagnosis not present

## 2022-06-17 DIAGNOSIS — O24429 Gestational diabetes mellitus in childbirth, unspecified control: Secondary | ICD-10-CM | POA: Diagnosis not present

## 2022-06-17 DIAGNOSIS — Z975 Presence of (intrauterine) contraceptive device: Secondary | ICD-10-CM

## 2022-06-17 DIAGNOSIS — O24419 Gestational diabetes mellitus in pregnancy, unspecified control: Secondary | ICD-10-CM | POA: Diagnosis present

## 2022-06-17 LAB — CBC
HCT: 29.7 % — ABNORMAL LOW (ref 36.0–46.0)
Hemoglobin: 9.4 g/dL — ABNORMAL LOW (ref 12.0–15.0)
MCH: 21.9 pg — ABNORMAL LOW (ref 26.0–34.0)
MCHC: 31.6 g/dL (ref 30.0–36.0)
MCV: 69.1 fL — ABNORMAL LOW (ref 80.0–100.0)
Platelets: 254 10*3/uL (ref 150–400)
RBC: 4.3 MIL/uL (ref 3.87–5.11)
RDW: 17.9 % — ABNORMAL HIGH (ref 11.5–15.5)
WBC: 8.9 10*3/uL (ref 4.0–10.5)
nRBC: 0 % (ref 0.0–0.2)

## 2022-06-17 LAB — GLUCOSE, CAPILLARY
Glucose-Capillary: 120 mg/dL — ABNORMAL HIGH (ref 70–99)
Glucose-Capillary: 96 mg/dL (ref 70–99)

## 2022-06-17 LAB — TYPE AND SCREEN
ABO/RH(D): A POS
Antibody Screen: NEGATIVE

## 2022-06-17 MED ORDER — ONDANSETRON HCL 4 MG/2ML IJ SOLN: 4.0000 mg | INTRAMUSCULAR | Status: DC | PRN

## 2022-06-17 MED ORDER — EPHEDRINE 5 MG/ML INJ: 10.0000 mg | INTRAVENOUS | Status: DC | PRN

## 2022-06-17 MED ORDER — FENTANYL CITRATE (PF) 100 MCG/2ML IJ SOLN
100.0000 ug | INTRAMUSCULAR | Status: DC | PRN
  Administered 2022-06-17: 100 ug via INTRAVENOUS
  Filled 2022-06-17: qty 2

## 2022-06-17 MED ORDER — FENTANYL CITRATE (PF) 100 MCG/2ML IJ SOLN
50.0000 ug | Freq: Once | INTRAMUSCULAR | Status: AC
  Administered 2022-06-17: 50 ug via INTRAVENOUS
  Filled 2022-06-17: qty 2

## 2022-06-17 MED ORDER — ACETAMINOPHEN 325 MG PO TABS: 650.0000 mg | ORAL_TABLET | ORAL | Status: DC | PRN

## 2022-06-17 MED ORDER — WITCH HAZEL-GLYCERIN EX PADS: 1.0000 | MEDICATED_PAD | CUTANEOUS | Status: DC | PRN

## 2022-06-17 MED ORDER — ONDANSETRON HCL 4 MG PO TABS: 4.0000 mg | ORAL_TABLET | ORAL | Status: DC | PRN

## 2022-06-17 MED ORDER — LACTATED RINGERS IV SOLN: 500.0000 mL | INTRAVENOUS | Status: DC | PRN

## 2022-06-17 MED ORDER — TETANUS-DIPHTH-ACELL PERTUSSIS 5-2.5-18.5 LF-MCG/0.5 IM SUSY: 0.5000 mL | PREFILLED_SYRINGE | Freq: Once | INTRAMUSCULAR | Status: DC

## 2022-06-17 MED ORDER — LACTATED RINGERS IV SOLN: INTRAVENOUS | Status: DC

## 2022-06-17 MED ORDER — SOD CITRATE-CITRIC ACID 500-334 MG/5ML PO SOLN: 30.0000 mL | ORAL | Status: DC | PRN

## 2022-06-17 MED ORDER — PHENYLEPHRINE 80 MCG/ML (10ML) SYRINGE FOR IV PUSH (FOR BLOOD PRESSURE SUPPORT): 80.0000 ug | PREFILLED_SYRINGE | INTRAVENOUS | Status: DC | PRN

## 2022-06-17 MED ORDER — LIDOCAINE HCL (PF) 1 % IJ SOLN
INTRAMUSCULAR | Status: DC | PRN
  Administered 2022-06-17: 4 mL via EPIDURAL
  Administered 2022-06-17: 6 mL via EPIDURAL

## 2022-06-17 MED ORDER — OXYTOCIN-SODIUM CHLORIDE 30-0.9 UT/500ML-% IV SOLN
1.0000 m[IU]/min | INTRAVENOUS | Status: DC
  Administered 2022-06-17: 2 m[IU]/min via INTRAVENOUS

## 2022-06-17 MED ORDER — PRENATAL MULTIVITAMIN CH
1.0000 | ORAL_TABLET | Freq: Every day | ORAL | Status: DC
  Administered 2022-06-18 – 2022-06-19 (×2): 1 via ORAL
  Filled 2022-06-17 (×2): qty 1

## 2022-06-17 MED ORDER — SODIUM CHLORIDE 0.9% FLUSH: 3.0000 mL | INTRAVENOUS | Status: DC | PRN

## 2022-06-17 MED ORDER — SODIUM CHLORIDE 0.9% FLUSH: 3.0000 mL | Freq: Two times a day (BID) | INTRAVENOUS | Status: DC

## 2022-06-17 MED ORDER — DIPHENHYDRAMINE HCL 50 MG/ML IJ SOLN: 12.5000 mg | INTRAMUSCULAR | Status: DC | PRN

## 2022-06-17 MED ORDER — OXYTOCIN BOLUS FROM INFUSION
333.0000 mL | Freq: Once | INTRAVENOUS | Status: AC
  Administered 2022-06-17: 333 mL via INTRAVENOUS

## 2022-06-17 MED ORDER — INSULIN ASPART 100 UNIT/ML IJ SOLN: 0.0000 [IU] | INTRAMUSCULAR | Status: DC

## 2022-06-17 MED ORDER — DIPHENHYDRAMINE HCL 25 MG PO CAPS
25.0000 mg | ORAL_CAPSULE | Freq: Four times a day (QID) | ORAL | Status: DC | PRN
  Administered 2022-06-17: 25 mg via ORAL
  Filled 2022-06-17: qty 1

## 2022-06-17 MED ORDER — COCONUT OIL OIL: 1.0000 | TOPICAL_OIL | Status: DC | PRN

## 2022-06-17 MED ORDER — FLEET ENEMA 7-19 GM/118ML RE ENEM: 1.0000 | ENEMA | Freq: Every day | RECTAL | Status: DC | PRN

## 2022-06-17 MED ORDER — ACETAMINOPHEN 325 MG PO TABS
650.0000 mg | ORAL_TABLET | ORAL | Status: DC | PRN
  Administered 2022-06-18 (×2): 650 mg via ORAL
  Filled 2022-06-17 (×2): qty 2

## 2022-06-17 MED ORDER — TERBUTALINE SULFATE 1 MG/ML IJ SOLN: 0.2500 mg | Freq: Once | INTRAMUSCULAR | Status: DC | PRN

## 2022-06-17 MED ORDER — OXYCODONE-ACETAMINOPHEN 5-325 MG PO TABS: 2.0000 | ORAL_TABLET | ORAL | Status: DC | PRN

## 2022-06-17 MED ORDER — DIBUCAINE (PERIANAL) 1 % EX OINT: 1.0000 | TOPICAL_OINTMENT | CUTANEOUS | Status: DC | PRN

## 2022-06-17 MED ORDER — ONDANSETRON HCL 4 MG/2ML IJ SOLN: 4.0000 mg | Freq: Four times a day (QID) | INTRAMUSCULAR | Status: DC | PRN

## 2022-06-17 MED ORDER — MEASLES, MUMPS & RUBELLA VAC IJ SOLR: 0.5000 mL | Freq: Once | INTRAMUSCULAR | Status: DC

## 2022-06-17 MED ORDER — SENNOSIDES-DOCUSATE SODIUM 8.6-50 MG PO TABS
2.0000 | ORAL_TABLET | Freq: Every day | ORAL | Status: DC
  Administered 2022-06-18 – 2022-06-19 (×2): 2 via ORAL
  Filled 2022-06-17 (×2): qty 2

## 2022-06-17 MED ORDER — BENZOCAINE-MENTHOL 20-0.5 % EX AERO
1.0000 | INHALATION_SPRAY | CUTANEOUS | Status: DC | PRN
  Administered 2022-06-17: 1 via TOPICAL
  Filled 2022-06-17: qty 56

## 2022-06-17 MED ORDER — BISACODYL 10 MG RE SUPP: 10.0000 mg | Freq: Every day | RECTAL | Status: DC | PRN

## 2022-06-17 MED ORDER — LIDOCAINE HCL (PF) 1 % IJ SOLN
30.0000 mL | INTRAMUSCULAR | Status: AC | PRN
  Administered 2022-06-17: 30 mL via SUBCUTANEOUS
  Filled 2022-06-17: qty 30

## 2022-06-17 MED ORDER — SIMETHICONE 80 MG PO CHEW: 80.0000 mg | CHEWABLE_TABLET | ORAL | Status: DC | PRN

## 2022-06-17 MED ORDER — ZOLPIDEM TARTRATE 5 MG PO TABS: 5.0000 mg | ORAL_TABLET | Freq: Every evening | ORAL | Status: DC | PRN

## 2022-06-17 MED ORDER — FENTANYL-BUPIVACAINE-NACL 0.5-0.125-0.9 MG/250ML-% EP SOLN
12.0000 mL/h | EPIDURAL | Status: DC | PRN
  Administered 2022-06-17: 12 mL/h via EPIDURAL
  Filled 2022-06-17: qty 250

## 2022-06-17 MED ORDER — SODIUM CHLORIDE 0.9 % IV SOLN: 250.0000 mL | INTRAVENOUS | Status: DC | PRN

## 2022-06-17 MED ORDER — OXYCODONE-ACETAMINOPHEN 5-325 MG PO TABS: 1.0000 | ORAL_TABLET | ORAL | Status: DC | PRN

## 2022-06-17 MED ORDER — OXYTOCIN-SODIUM CHLORIDE 30-0.9 UT/500ML-% IV SOLN
2.5000 [IU]/h | INTRAVENOUS | Status: DC
  Filled 2022-06-17: qty 500

## 2022-06-17 MED ORDER — LACTATED RINGERS IV SOLN: 500.0000 mL | Freq: Once | INTRAVENOUS | Status: DC

## 2022-06-17 MED ORDER — IBUPROFEN 600 MG PO TABS
600.0000 mg | ORAL_TABLET | Freq: Four times a day (QID) | ORAL | Status: DC
  Administered 2022-06-17 – 2022-06-19 (×7): 600 mg via ORAL
  Filled 2022-06-17 (×7): qty 1

## 2022-06-17 NOTE — Discharge Summary (Signed)
Postpartum Discharge Summary    Patient Name: Meagan Richardson DOB: 03/24/1996 MRN: [id number]  Date of admission: 06/17/2022 Delivery date:06/17/2022  Delivering provider: Wells Guiles R  Date of discharge: 06/19/2022  Admitting diagnosis: Encounter for induction of labor [Z34.90] Intrauterine pregnancy: [redacted]w[redacted]d    Secondary diagnosis:  Principal Problem:   Encounter for induction of labor Active Problems:   Supervision of normal first pregnancy, antepartum   Scoliosis   Gestational diabetes  Additional problems: A1DM    Discharge diagnosis: Term Pregnancy Delivered and GDM A1                                              Post partum procedures: n/a Augmentation: AROM and Pitocin Complications: None  Hospital course: Induction of Labor With Vaginal Delivery   26y.o. yo G1P0 at 376w2das admitted to the hospital 06/17/2022 for induction of labor.  Indication for induction:  uncontrolled A1DM .  Patient had an labor course complicated by nothing.  Membrane Rupture Time/Date: 5:00 PM ,06/17/2022   Delivery Method:Vaginal, Spontaneous  Episiotomy: None  Lacerations:  2nd degree;Perineal;Labial  Details of delivery can be found in separate delivery note.  Patient had a postpartum course complicated bynone. Patient is discharged home 06/19/22.  Newborn Data: Birth date:06/17/2022  Birth time:8:24 PM  Gender:Female  Living status:Living  Apgars:9 ,9  Weight:2940 g   Magnesium Sulfate received: No BMZ received: No Rhophylac:N/A MMR:N/A T-DaP:Given prenatally Flu: N/A Transfusion:No  Physical exam  Vitals:   06/18/22 0744 06/18/22 1420 06/18/22 2030 06/19/22 0544  BP: 113/68 121/77 120/65 121/77  Pulse: 71 73 86 78  Resp: _0 Temp: 98 F (36.7 C) 98.6 F (37 C) 98.1 F (36.7 C) 98 F (36.7 C)  TempSrc: Oral Oral Oral   SpO2: 98%  99% 98%  Weight:      Height:       General: alert, cooperative, and no distress Lochia: appropriate Uterine  Fundus: firm Incision: N/A DVT Evaluation: No evidence of DVT seen on physical exam. Labs: Lab Results  Component Value Date   WBC 8.9 06/17/2022   HGB 9.4 (L) 06/17/2022   HCT 29.7 (L) 06/17/2022   MCV 69.1 (L) 06/17/2022   PLT 254 06/17/2022      Latest Ref Rng & Units 04/22/2022    8:36 PM  CMP  Glucose 70 - 99 mg/dL 102   BUN 6 - 20 mg/dL 5   Creatinine 0.44 - 1.00 mg/dL 0.69   Sodium 135 - 145 mmol/L 133   Potassium 3.5 - 5.1 mmol/L 3.4   Chloride 98 - 111 mmol/L 105   CO2 22 - 32 mmol/L 20   Calcium 8.9 - 10.3 mg/dL 9.2   Total Protein 6.5 - 8.1 g/dL 5.9   Total Bilirubin 0.3 - 1.2 mg/dL 0.6   Alkaline Phos 38 - 126 U/L 69   AST 15 - 41 U/L 29   ALT 0 - 44 U/L 22    Edinburgh Score:    06/18/2022    7:44 AM  Edinburgh Postnatal Depression Scale Screening Tool  I have been able to laugh and see the funny side of things. 0  I have looked forward with enjoyment to things. 0  I have blamed myself unnecessarily when things went wrong. 0  I have been anxious or worried for no good reason.  0  I have felt scared or panicky for no good reason. 0  Things have been getting on top of me. 0  I have been so unhappy that I have had difficulty sleeping. 0  I have felt sad or miserable. 0  I have been so unhappy that I have been crying. 0  The thought of harming myself has occurred to me. 0  Edinburgh Postnatal Depression Scale Total 0     After visit meds:  Allergies as of 06/19/2022   No Known Allergies      Medication List     TAKE these medications    Accu-Chek Guide test strip Generic drug: glucose blood Use to check blood sugars four times a day was instructed   Accu-Chek Guide w/Device Kit 1 Device by Does not apply route 4 (four) times daily.   Accu-Chek Softclix Lancets lancets 1 each by Other route 4 (four) times daily.   acetaminophen 325 MG tablet Commonly known as: Tylenol Take 2 tablets (650 mg total) by mouth every 4 (four) hours as needed  (for pain scale < 4).   benzocaine-Menthol 20-0.5 % Aero Commonly known as: DERMOPLAST Apply 1 Application topically as needed (perineal discomfort).   ibuprofen 600 MG tablet Commonly known as: ADVIL Take 1 tablet (600 mg total) by mouth every 6 (six) hours.   multivitamin-prenatal 27-0.8 MG Tabs tablet Take 1 tablet by mouth daily at 12 noon.   pantoprazole 40 MG tablet Commonly known as: Protonix Take 1 tablet (40 mg total) by mouth daily.   senna-docusate 8.6-50 MG tablet Commonly known as: Senokot-S Take 2 tablets by mouth daily.   simethicone 80 MG chewable tablet Commonly known as: MYLICON Chew 1 tablet (80 mg total) by mouth as needed for flatulence.         Discharge home in stable condition Infant Feeding: Bottle and Breast Infant Disposition:home with mother Discharge instruction: per After Visit Summary and Postpartum booklet. Activity: Advance as tolerated. Pelvic rest for 6 weeks.  Diet: routine diet Future Appointments: Future Appointments  Date Time Provider [place]  08/05/2022  8:45 AM [facility] None  08/05/2022  9:15 AM Shelly Bombard, MD [place] None   Follow up Visit: Roma Schanz, CNM  P [facility] Please schedule this patient for PP visit in: 4-6  High risk pregnancy complicated by: GDM  Delivery mode:  SVD  Anticipated Birth Control:  Nexplanon-plans inpatient  PP Procedures needed: 2 hour GTT  Schedule Integrated [place] visit: no  Provider: Any provider   06/19/2022 Shelda Pal, DO

## 2022-06-17 NOTE — Lactation Note (Signed)
This note was copied from a baby's chart. Lactation Consultation Note  Patient Name: Meagan Richardson [id number] Date: 06/17/2022 Reason for consult: L&D Initial assessment Age:25 hours Assisted baby to the breast and she latched well. Suckled well at first then needed some stimulation to suckle. Suckles are real strong. Stimulating baby to suck and compressing breast to express colostrum into baby's mouth. Mom is breast/formula feeding. Encouraged to BF before giving formula. Praised and Forensic psychologist. Will f/u on MBU. Left for bonding. Baby still on the breast suckling at intervals.  Maternal Data Does the patient have breastfeeding experience prior to this delivery?: No  Feeding    LATCH Score Latch: Repeated attempts needed to sustain latch, nipple held in mouth throughout feeding, stimulation needed to elicit sucking reflex.  Audible Swallowing: None  Type of Nipple: Everted at rest and after stimulation (short shaft/compressible)  Comfort (Breast/Nipple): Soft / non-tender  Hold (Positioning): Full assist, staff holds infant at breast  LATCH Score: 5   Lactation Tools Discussed/Used    Interventions Interventions: Adjust position;Support pillows;Assisted with latch;Skin to skin;Position options;Hand express;Breast compression  Discharge    Consult Status Consult Status: Follow-up from L&D Date: 06/18/22 Follow-up type: In-patient    Theodoro Kalata 06/17/2022, 9:20 PM

## 2022-06-17 NOTE — H&P (Signed)
OBSTETRIC ADMISSION HISTORY AND PHYSICAL  Meagan Richardson is a 26 y.o. female G1P0 with IUP at 36w2dby LMP presenting for IOL for poorly controlled GDM A1. She reports +FMs, No LOF, no VB, no blurry vision, headaches or peripheral edema, and RUQ pain.  She plans on bottle feeding. She request nexplanon for birth control. [facility] Dating: By LMP --->  Estimated Date of Delivery: 07/06/22  Sono:    @[redacted]w[redacted]d , CWD, normal anatomy, vertex presentation, 3233g, 67% EFW   Prenatal History/Complications:  A1 GDM - poorly controlled   Nursing Staff Provider  Office Location  [facility] Dating    P[place]asper[x ] Traditional [ ]  Centering [ ]  Mom-Baby Dyad    Language  English Anatomy [place]   Flu Vaccine  No Genetic/Carrier Screen  NIPS:   Low-risk AFP:   Negative Horizon: silent carrier alpha thala  TDaP Vaccine  Given 04/18/22 Hgb A1C or  GTT Early  Third trimester   COVID Vaccine yes   LAB RESULTS   Rhogam   Blood Type A/Positive/-- (05/02 1059)   Baby Feeding Plan Bottle Antibody Negative (05/02 1059)  Contraception Nexplanon Rubella 12.40 (05/02 1059)  Circumcision Yes if boy RPR Non Reactive (05/02 1059)   Pediatrician  undecided HBsAg Negative (05/02 1059)   Support Person FOB, mom HCVAb  neg  Prenatal Classes  HIV Non Reactive (05/02 1059)     BTL Consent  GBS   (For PCN allergy, check sensitivities)   VBAC Consent  Pap NILM 01/2022       DME Rx [x ] BP cuff [x ] Weight Scale Waterbirth  [ ]  Class [ ]  Consent [ ]  CNM visit  PHQ9 & GAD7 [ x ] new OB [ x ] 28 weeks  [ x ] 36 weeks Induction  [ ]  Orders Entered [ ] Foley Y/N    Past Medical History: Past Medical History:  Diagnosis Date   Gestational diabetes    Scoliosis     Past Surgical History: Past Surgical History:  Procedure Laterality Date   NO PAST SURGERIES      Obstetrical History: OB History     Gravida  1   Para      Term      Preterm      AB      Living   0      SAB      IAB      Ectopic      Multiple      Live Births              Social History Social History   Socioeconomic History   Marital status: Single    Spouse name: Not on file   Number of children: Not on file   Years of education: Not on file   Highest education level: Not on file  Occupational History   Not on file  Tobacco Use   Smoking status: Never   Smokeless tobacco: Never  Vaping Use   Vaping Use: Never used  Substance and Sexual Activity   Alcohol use: No   Drug use: No   Sexual activity: Yes  Other Topics Concern   Not on file  Social History Narrative   Not on file   Social Determinants of Health   Financial Resource Strain: Not on file  Food Insecurity: No Food Insecurity (06/17/2022)   Hunger Vital Sign    Worried About  Running Out of Food in the Last Year: Never true    [place] in the Last Year: Never true  Transportation Needs: No Transportation Needs (06/17/2022)   PRAPARE - Hydrologist (Medical): No    Lack of Transportation (Non-Medical): No  Physical Activity: Not on file  Stress: Not on file  Social Connections: Not on file    Family History: Family History  Problem Relation Age of Onset   Asthma Paternal Aunt    Diabetes Paternal Grandmother    Diabetes Paternal Grandfather    Asthma Other    Cancer Neg Hx    Heart disease Neg Hx    Hypertension Neg Hx    Stroke Neg Hx     Allergies: No Known Allergies  Medications Prior to Admission  Medication Sig Dispense Refill Last Dose   Accu-Chek Softclix Lancets lancets 1 each by Other route 4 (four) times daily. 100 each 12 06/16/2022   Blood Glucose Monitoring Suppl (ACCU-CHEK GUIDE) w/Device KIT 1 Device by Does not apply route 4 (four) times daily. 1 kit 0 06/16/2022   glucose blood (ACCU-CHEK GUIDE) test strip Use to check blood sugars four times a day was instructed 50 each 12 06/16/2022   Prenatal Vit-Fe Fumarate-FA  (MULTIVITAMIN-PRENATAL) 27-0.8 MG TABS tablet Take 1 tablet by mouth daily at 12 noon.   06/17/2022 at 0700   pantoprazole (PROTONIX) 40 MG tablet Take 1 tablet (40 mg total) by mouth daily. (Patient not taking: Reported on 06/09/2022) 30 tablet 0      Review of Systems   All systems reviewed and negative except as stated in HPI  Blood pressure 121/85, pulse 77, temperature 97.8 F (36.6 C), temperature source Oral, height 5' 2"  (1.575 m), weight 68.5 kg, last menstrual period 09/29/2021. General appearance: alert, cooperative, and appears stated age Lungs: clear to auscultation bilaterally Heart: regular rate and rhythm Abdomen: soft, non-tender; bowel sounds normal Pelvic: see below Extremities: Homans sign is negative, no sign of DVT  Presentation: cephalic Fetal monitoring: 132bpm /moderate variabilty/ + accels, no decels  Uterine activity: every 5-7 mins, mild   On admission: 3.5/ 50/ -2   Prenatal labs: ABO, Rh: --/--/A POS (10/10 1326) Antibody: NEG (10/10 1326) Rubella: 12.40 (05/02 1059) RPR: Non Reactive (08/11 0954)  HBsAg: Negative (05/02 1059)  HIV: Non Reactive (08/11 0954)  GBS: Negative/-- (10/02 1107)  2 hr Glucola abnormal Genetic screening  low risk Anatomy US normal  Prenatal Transfer Tool  Maternal Diabetes: Yes:  Diabetes Type:  Diet controlled Genetic Screening: low risk Maternal Ultrasounds/Referrals: Normal Fetal Ultrasounds or other Referrals:  None Maternal Substance Abuse:  No Significant Maternal Medications:  None Significant Maternal Lab Results:  Group B Strep negative Number of Prenatal Visits:greater than 3 verified prenatal visits Other Comments:  None  Results for orders placed or performed during the hospital encounter of 06/17/22 (from the past 24 hour(s))  CBC   Collection Time: 06/17/22  1:02 PM  Result Value Ref Range   WBC 8.9 4.0 - 10.5 K/uL   RBC 4.30 3.87 - 5.11 MIL/uL   Hemoglobin 9.4 (L) 12.0 - 15.0 g/dL   HCT 29.7  (L) 36.0 - 46.0 %   MCV 69.1 (L) 80.0 - 100.0 fL   MCH 21.9 (L) 26.0 - 34.0 pg   MCHC 31.6 30.0 - 36.0 g/dL   RDW 17.9 (H) 11.5 - 15.5 %   Platelets 254 150 - 400 K/uL   nRBC 0.0  0.0 - 0.2 %  Type and screen [place]   Collection Time: 06/17/22  1:26 PM  Result Value Ref Range   ABO/RH(D) A POS    Antibody Screen NEG    Sample Expiration      06/20/2022,2359 Performed at [facility], [place] [address] [place], [place] [address]   Glucose, capillary   Collection Time: 06/17/22  1:53 PM  Result Value Ref Range   Glucose-Capillary 120 (H) 70 - 99 mg/dL  Glucose, capillary   Collection Time: 06/17/22  4:08 PM  Result Value Ref Range   Glucose-Capillary 96 70 - 99 mg/dL    Patient Active Problem List   Diagnosis Date Noted   Encounter for induction of labor 06/17/2022   Gestational diabetes 04/19/2022   Alpha thalassemia silent carrier 01/28/2022   Supervision of normal first pregnancy, antepartum 01/07/2022   Scoliosis 01/07/2022   Shortness of breath 10/09/2016    Assessment/Plan:  Meagan Richardson is a 26 y.o. G1P0 at 36w2dhere for IOL due to poorly controlled A1GDM.  #Labor:admit to L&D. Initial exam: 3.5/ 50%/-3. Will start with pitocin, titrate to adequate contractions.  Consider AROM in about 2 hrs if head well applied to cervix #Pain:  Desires epidural in active labor #FWB:  Cat 1 #ID:  GBS neg #MOF: bottle  #MOC:nexplanon #Circ:  N/a  CLiliane ChannelMD MPH OB Fellow, F[place] Villagefor [place]10/06/2022

## 2022-06-17 NOTE — Anesthesia Procedure Notes (Signed)
Epidural Patient location during procedure: OB Start time: 06/17/2022 5:48 PM End time: 06/17/2022 5:58 PM  Staffing Anesthesiologist: Lidia Collum, MD Performed: anesthesiologist   Preanesthetic Checklist Completed: patient identified, IV checked, risks and benefits discussed, monitors and equipment checked, pre-op evaluation and timeout performed  Epidural Patient position: sitting Prep: DuraPrep Patient monitoring: heart rate, continuous pulse ox and blood pressure Approach: midline Location: L3-L4 Injection technique: LOR air  Needle:  Needle type: Tuohy  Needle gauge: 17 G Needle length: 9 cm Needle insertion depth: 5 cm Catheter type: closed end flexible Catheter size: 19 Gauge Catheter at skin depth: 10 cm Test dose: negative  Assessment Events: blood not aspirated, injection not painful, no injection resistance, no paresthesia and negative IV test  Additional Notes Reason for block:procedure for pain

## 2022-06-17 NOTE — Anesthesia Preprocedure Evaluation (Signed)
Anesthesia Evaluation  Patient identified by MRN, date of birth, ID band Patient awake    Reviewed: Allergy & Precautions, Patient's Chart, lab work & pertinent test results  Airway Mallampati: II  TM Distance: >3 FB Neck ROM: Full    Dental no notable dental hx.    Pulmonary neg pulmonary ROS,    Pulmonary exam normal breath sounds clear to auscultation       Cardiovascular negative cardio ROS Normal cardiovascular exam Rhythm:Regular Rate:Normal     Neuro/Psych negative neurological ROS  negative psych ROS   GI/Hepatic negative GI ROS, Neg liver ROS,   Endo/Other  diabetes, Well Controlled, Gestational  Renal/GU negative Renal ROS  negative genitourinary   Musculoskeletal Scoliosis    Abdominal   Peds negative pediatric ROS (+)  Hematology  (+) Blood dyscrasia, anemia , Hb 9.4, plt 254   Anesthesia Other Findings   Reproductive/Obstetrics (+) Pregnancy                             Anesthesia Physical Anesthesia Plan  ASA: 2  Anesthesia Plan: Epidural   Post-op Pain Management:    Induction:   PONV Risk Score and Plan: 2  Airway Management Planned: Natural Airway  Additional Equipment: None  Intra-op Plan:   Post-operative Plan:   Informed Consent: I have reviewed the patients History and Physical, chart, labs and discussed the procedure including the risks, benefits and alternatives for the proposed anesthesia with the patient or authorized representative who has indicated his/her understanding and acceptance.       Plan Discussed with:   Anesthesia Plan Comments:         Anesthesia Quick Evaluation

## 2022-06-17 NOTE — Progress Notes (Signed)
Labor Progress Note FADIA MARLAR is a 26 y.o. G1P0 at [redacted]w[redacted]d presented for IOL due to poorly controlled A1 GDM S: doing well. Feeling some discomfort from contractions.   O:  BP (!) 111/51   Pulse 73   Temp 98.4 F (36.9 C) (Oral)   Ht 5\' 2"  (1.575 m)   Wt 68.5 kg   LMP 09/29/2021   BMI 27.62 kg/m  EFM: 130/moderate/+accels, no decels  Toco: contractions every 3-5 mins  CVE: Dilation: 4 Effacement (%): 50 Station: -2 Presentation: Vertex Exam by:: Taila Basinski, MD   A&P: 26 y.o. G1P0 [redacted]w[redacted]d IOL for poorly controlled A1GDM #Labor: Progressing well. AROM done with clear fluid #Pain: IV fentanyl, epidural when ready #FWB: category 1 #GBS negative  A1 GDM: initial BS elevated, but had been after a meal. Repeat well controlled. POCT q4hrs during latent labor, q2hrs in active labor. Insulin sliding scale.  Mccormick Macon Sherrilyn Rist, MD 5:09 PM;

## 2022-06-18 ENCOUNTER — Ambulatory Visit: Payer: Medicaid Other | Admitting: Physical Therapy

## 2022-06-18 LAB — GLUCOSE, CAPILLARY: Glucose-Capillary: 98 mg/dL (ref 70–99)

## 2022-06-18 LAB — RPR: RPR Ser Ql: NONREACTIVE

## 2022-06-18 MED ORDER — ETONOGESTREL 68 MG ~~LOC~~ IMPL
68.0000 mg | DRUG_IMPLANT | Freq: Once | SUBCUTANEOUS | Status: AC
  Administered 2022-06-19: 68 mg via SUBCUTANEOUS
  Filled 2022-06-18: qty 1

## 2022-06-18 MED ORDER — LIDOCAINE HCL 1 % IJ SOLN
0.0000 mL | Freq: Once | INTRAMUSCULAR | Status: AC | PRN
  Administered 2022-06-19: 20 mL via INTRADERMAL
  Filled 2022-06-18: qty 20

## 2022-06-18 NOTE — Anesthesia Postprocedure Evaluation (Signed)
Anesthesia Post Note  Patient: Meagan Richardson  Procedure(s) Performed: AN AD [place]     Patient location during evaluation: Mother Baby Anesthesia Type: Epidural Level of consciousness: awake and alert Pain management: pain level controlled Vital Signs Assessment: post-procedure vital signs reviewed and stable Respiratory status: spontaneous breathing, nonlabored ventilation and respiratory function stable Cardiovascular status: stable Postop Assessment: no headache, no backache and epidural receding Anesthetic complications: no   No notable events documented.  Last Vitals:  Vitals:   06/18/22 0330 06/18/22 0744  BP: 129/83 113/68  Pulse: 68 71  Resp: 18 17  Temp: 36.7 C 36.7 C  SpO2: 100% 98%    Last Pain:  Vitals:   06/18/22 0744  TempSrc: Oral  PainSc: 0-No pain   Pain Goal: Patients Stated Pain Goal: 0 (06/18/22 0300)                 Nonah Mattes N

## 2022-06-18 NOTE — Lactation Note (Signed)
This note was copied from a baby's chart. Lactation Consultation Note  Patient Name: Meagan Richardson Today's Date: 06/18/2022 Reason for consult: Mother's request;Difficult latch;Follow-up assessment;1st time breastfeeding;Early term 37-38.6wks;Maternal endocrine disorder Age:25 hours P1, ETI female infant, -1% weight loss. Birth Parent current feeding choice is : breastfeeding and supplementing infant with formula.  LC changed stools while in room, infant had 4 stools and 3 voids since birth. Per Birth Parent she doesn't have pump at home, LC explained how to use hand pump in the DEBP kit and Birth Parent understands she can take it home for prn use. Per Birth Parent , she would like assistance with latching infant at the breast, past 3 feedings has been formula only. LC discussed doing reverse pressure softening to help evert nipple shaft out more prior to latching infant, Birth Parent latched infant on her left breast using the cross cradle hold position, infant latched with depth, sustaining latch and BF for 9 minutes. LC observed that infant has upper labial frenulum ( tight tissue close to upper lip and gumline), Birth Parent may need flange top lip outward if experiencing any discomfort with feeding.  Afterwards infant was supplemented with 2 mls of formula and appeared content. LC set Birth Parent up with DEBP, Birth Parent was pumping as LC left the room. Birth Parent understands that EBM is safe at room temp. For 4 hours whereas formula must be used within 1 hour.   Birth Parent current feeding plan:  1- Birth Parent will continue to work on latching infant 1st at the breast for every feeding and continue  to ask  RN/LC for latch assistance if needed, Birth Parent will continue to BF infant according to hunger cues, on demand, 8 to 12+ times within 24 hours, STS. 2- Birth Parent will supplement infant with EBM / then formula if infant is still cuing to feeding, (This is Birth  Parent's feeding choice). 3- Birth Parent will continue to use DEBP every 3 hours for 15 minutes on initial setting and give infant any EBM first by finger feeding or spoon.  Maternal Data    Feeding Mother's Current Feeding Choice: Breast Milk and Formula  LATCH Score Latch: Grasps breast easily, tongue down, lips flanged, rhythmical sucking.  Audible Swallowing: A few with stimulation  Type of Nipple: Everted at rest and after stimulation (LC ask Birth Parent to do reverse pressure softening to help evert nipple shaft out more and LC observed areola edema.)  Comfort (Breast/Nipple): Soft / non-tender  Hold (Positioning): Assistance needed to correctly position infant at breast and maintain latch.  LATCH Score: 8   Lactation Tools Discussed/Used Tools: Pump;Flanges Flange Size: 21 Breast pump type: Double-Electric Breast Pump Pump Education: Setup, frequency, and cleaning;Milk Storage Reason for Pumping: Infant is ETI, previous three feedings has been formula only, Per Birth Parent infant not been latching  would like latch assistance. Pumping frequency: Birth Parent will use DEBP every 3 hours for 15 mintues on inital setting.  Interventions Interventions: Skin to skin;Assisted with latch;Reverse pressure;Breast compression;Adjust position;Support pillows;Position options;Expressed milk;DEBP;Education;Hand pump;Pace feeding;Breast massage  Discharge Pump: Manual (Birth Parent show how to use hand pump in DEBP kit.)  Consult Status Consult Status: Follow-up Date: 06/19/22 Follow-up type: In-patient    Robin Ollison 06/18/2022, 1:42 PM    

## 2022-06-18 NOTE — Progress Notes (Signed)
CNM attempted to present to bedside for Nexplanon placement. Patient currently working with [facility] and not available. Will attempt again later this afternoon.  Mallie Snooks, [place], MSN, CNM Certified Nurse Midwife, Product/process development scientist for [employer], [place]

## 2022-06-18 NOTE — Lactation Note (Signed)
This note was copied from a baby's chart. Lactation Consultation Note  Patient Name: Girl Aeliana Spates [id number] Date: 06/18/2022 Reason for consult: Initial assessment;Primapara;Early term 37-38.6wks;Maternal endocrine disorder Age:25 hours Mom stated she attempted to BF but baby wasn't interested so she gave formula and baby took 12 ml. Baby has spit up some formula mom stated. Newborn feeding habits, behavior, STS, I&O, reviewed. Mom very tired. Encouraged mom to rest and wake baby if hasn't cued in 3 hrs. BF on demand. Call for assistance as needed.  Maternal Data Does the patient have breastfeeding experience prior to this delivery?: No  Feeding    LATCH Score       Type of Nipple: Everted at rest and after stimulation  Comfort (Breast/Nipple): Soft / non-tender         Lactation Tools Discussed/Used    Interventions Interventions: Breast feeding basics reviewed;LC Services brochure  Discharge    Consult Status Consult Status: Follow-up Date: 06/18/22 (in pm) Follow-up type: In-patient    Romayne Ticas, [place] 06/18/2022, 6:30 AM

## 2022-06-18 NOTE — Progress Notes (Signed)
Patient ID: ANAROSA KUBISIAK, female   DOB: June 17, 1996, 26 y.o.   MRN: [id number]  CNM presented to bedside for Nexplanon consent and placement. Patient declines Nexplanon at this time. I offered to come back this evening for placement around 6 or 7pm. Patient states she does not want her Nexplanon placed today, prefers tomorrow 06/19/2022 prior to hospital discharge.  Mallie Snooks, [place], MSN, CNM Certified Nurse Midwife, Product/process development scientist for [employer], [place]

## 2022-06-18 NOTE — Progress Notes (Addendum)
Post Partum Day 1 Subjective: Eating, drinking, voiding, ambulating well.  +flatus.  Lochia and pain wnl.  Denies dizziness, lightheadedness, or sob. No complaints.   Objective: Blood pressure 129/83, pulse 68, temperature 98 F (36.7 C), temperature source Oral, resp. rate 18, height 5\' 2"  (1.575 m), weight 68.5 kg, last menstrual period 09/29/2021, SpO2 100 %, unknown if currently breastfeeding.  Physical Exam:  General: alert, cooperative, and no distress Lochia: appropriate Uterine Fundus: firm Incision: n/a DVT Evaluation: No evidence of DVT seen on physical exam. Negative Homan's sign. No cords or calf tenderness. No significant calf/ankle edema.  Recent Labs    06/17/22 1302  HGB 9.4*  HCT 29.7*   CBG 0730 this morning 98 (not fasting)    Assessment/Plan: Plan for discharge tomorrow, Breastfeeding, and Contraception plans Nexplanon    LOS: 1 day   Roma Schanz, CNM 06/18/2022, 7:58 AM

## 2022-06-19 ENCOUNTER — Encounter (HOSPITAL_COMMUNITY): Payer: Self-pay | Admitting: Family Medicine

## 2022-06-19 ENCOUNTER — Other Ambulatory Visit (HOSPITAL_COMMUNITY): Payer: Self-pay

## 2022-06-19 DIAGNOSIS — Z30017 Encounter for initial prescription of implantable subdermal contraceptive: Secondary | ICD-10-CM | POA: Diagnosis not present

## 2022-06-19 HISTORY — PX: NEXPLANON TRAY: NUR84248

## 2022-06-19 MED ORDER — SENNOSIDES-DOCUSATE SODIUM 8.6-50 MG PO TABS
2.0000 | ORAL_TABLET | Freq: Every day | ORAL | 0 refills | Status: AC
  Filled 2022-06-19: qty 30, 15d supply, fill #0

## 2022-06-19 MED ORDER — INFLUENZA VAC SPLIT QUAD 0.5 ML IM SUSY
0.5000 mL | PREFILLED_SYRINGE | Freq: Once | INTRAMUSCULAR | Status: AC
  Administered 2022-06-19: 0.5 mL via INTRAMUSCULAR
  Filled 2022-06-19: qty 0.5

## 2022-06-19 MED ORDER — IBUPROFEN 600 MG PO TABS
600.0000 mg | ORAL_TABLET | Freq: Four times a day (QID) | ORAL | 0 refills | Status: AC
  Filled 2022-06-19: qty 30, 8d supply, fill #0

## 2022-06-19 MED ORDER — SIMETHICONE 80 MG PO CHEW
80.0000 mg | CHEWABLE_TABLET | ORAL | 0 refills | Status: AC | PRN
  Filled 2022-06-19: qty 30, 30d supply, fill #0

## 2022-06-19 MED ORDER — BENZOCAINE-MENTHOL 20-0.5 % EX AERO
1.0000 | INHALATION_SPRAY | CUTANEOUS | 0 refills | Status: AC | PRN
  Filled 2022-06-19: qty 78, fill #0

## 2022-06-19 MED ORDER — INFLUENZA VAC SPLIT QUAD 0.5 ML IM SUSY: 0.5000 mL | PREFILLED_SYRINGE | INTRAMUSCULAR | Status: DC

## 2022-06-19 MED ORDER — ACETAMINOPHEN 325 MG PO TABS
650.0000 mg | ORAL_TABLET | ORAL | 0 refills | Status: AC | PRN
  Filled 2022-06-19: qty 30, 3d supply, fill #0

## 2022-06-19 NOTE — Procedures (Signed)
Post-Placental Nexplanon Insertion Procedure Note  Patient was identified. Informed consent was signed, signed copy in chart. A time-out was performed.    The insertion site was identified 8-10 cm (3-4 inches) from the medial epicondyle of the humerus and 3-5 cm (1.25-2 inches) posterior to (below) the sulcus (groove) between the biceps and triceps muscles of the patient's left arm and marked. The site was prepped and draped in the usual sterile fashion. Pt was prepped with alcohol swab and then injected with 3 cc of 1% lidocaine. The site was prepped with betadine. Nexplanon removed form packaging,  Device confirmed in needle, then inserted full length of needle and withdrawn per handbook instructions. Provider and patient verified presence of the implant in the woman's arm by palpation. Pt insertion site was covered with steristrips/adhesive bandage and pressure bandage. There was minimal blood loss. Patient tolerated procedure well.  Patient was given post procedure instructions and Nexplanon user card with expiration date. Condoms were recommended for STI prevention. Patient was asked to keep the pressure dressing on for 24 hours to minimize bruising and keep the adhesive bandage on for 3-5 days. The patient verbalized understanding of the plan of care and agrees.   Lot # [id number] Expiration Date 2025-09   Shelda Pal, [place] Fellow, Faculty practice [place] for [facility] 06/19/22  10:26 AM

## 2022-06-19 NOTE — Lactation Note (Signed)
This note was copied from a baby's chart. Lactation Consultation Note  Patient Name: Meagan Richardson [id number] Date: 06/19/2022 Reason for consult: Follow-up assessment;Early term 37-38.6wks;1st time breastfeeding;Primapara;Maternal endocrine disorder Age:25 hours   LC in to visit with P1 Mom of ET infant on day of discharge.  Baby is at a 5% weight loss and has been exclusively formula feeding per Mom's choice currently.  Mom reports she tried breastfeeding, but her choice is to continue formula feeding only.  Baby in Mom's arms and starting to cue.  Parker asked if she would like assistance with latching, but she declined politely.  Engorgement treatment for non-breastfeeding Moms reviewed.  Mom aware of OP lactation support after discharge.  Mom has a hand pump for home use.   Interventions Interventions: Breast feeding basics reviewed;Ice;Education;Hand pump  Discharge Discharge Education: Engorgement and breast care  Consult Status Consult Status: Complete (mother declined follow up) Date: 06/19/22 Follow-up type: Call as needed    Broadus John 06/19/2022, 8:23 AM

## 2022-06-23 ENCOUNTER — Encounter: Payer: Medicaid Other | Admitting: Obstetrics and Gynecology

## 2022-06-25 ENCOUNTER — Ambulatory Visit: Payer: Medicaid Other | Admitting: Physical Therapy

## 2022-06-27 ENCOUNTER — Telehealth (HOSPITAL_COMMUNITY): Payer: Self-pay | Admitting: *Deleted

## 2022-06-27 NOTE — Telephone Encounter (Signed)
Mom reports feeling good. No concerns about herself at this time. EPDS=0 [facility] score=0) Mom reports baby is doing well. Feeding, peeing, and pooping without difficulty. Safe sleep reviewed. Mom reports no concerns about baby at present.  Odis Hollingshead, RN 06-27-2022 at 12:11pm

## 2022-06-30 ENCOUNTER — Encounter: Payer: BC Managed Care – PPO | Admitting: Advanced Practice Midwife

## 2022-08-05 ENCOUNTER — Encounter: Payer: Self-pay | Admitting: Family Medicine

## 2022-08-05 ENCOUNTER — Other Ambulatory Visit: Payer: Medicaid Other

## 2022-08-05 ENCOUNTER — Ambulatory Visit (INDEPENDENT_AMBULATORY_CARE_PROVIDER_SITE_OTHER): Payer: Medicaid Other | Admitting: Family Medicine

## 2022-08-05 DIAGNOSIS — Z8632 Personal history of gestational diabetes: Secondary | ICD-10-CM | POA: Diagnosis not present

## 2022-08-05 DIAGNOSIS — O24410 Gestational diabetes mellitus in pregnancy, diet controlled: Secondary | ICD-10-CM | POA: Diagnosis not present

## 2022-08-05 NOTE — Progress Notes (Signed)
Post Partum Visit Note  Meagan Richardson is a 26 y.o. G39P1001 female who presents for a postpartum visit. She is 7 weeks postpartum following a normal spontaneous vaginal delivery.  I have fully reviewed the prenatal and intrapartum course. The delivery was at 37 gestational weeks.  Anesthesia: epidural. Postpartum course has been uncomplicated. Baby is doing well. Baby is feeding by bottle - Similac Sensitive RS. Bleeding  some days are heavier than others . Bowel function is normal. Bladder function is normal. Patient is not sexually active. Contraception method is Nexplanon. Postpartum depression screening: negative.  She has a nexplanon in place, which was placed at the hospital. Has been having vaginal bleeding since delivery. Has lightened up some, using about 2 pads a day. No abdominal cramping. No dizziness or lightheadedness. Yet to resume.  The pregnancy intention screening data noted above was reviewed. Potential methods of contraception were discussed. The patient elected to proceed with No data recorded.   Edinburgh Postnatal Depression Scale - 08/05/22 0920       Edinburgh Postnatal Depression Scale:  In the Past 7 Days   I have been able to laugh and see the funny side of things. 0    I have looked forward with enjoyment to things. 0    I have blamed myself unnecessarily when things went wrong. 0    I have been anxious or worried for no good reason. 0    I have felt scared or panicky for no good reason. 0    Things have been getting on top of me. 0    I have been so unhappy that I have had difficulty sleeping. 0    I have felt sad or miserable. 0    I have been so unhappy that I have been crying. 0    The thought of harming myself has occurred to me. 0    Edinburgh Postnatal Depression Scale Total 0             Health Maintenance Due  Topic Date Due   COVID-19 Vaccine (1) Never done   HPV VACCINES (1 - 2-dose series) Never done    The following portions of  the patient's history were reviewed and updated as appropriate: allergies, current medications, past family history, past medical history, past social history, past surgical history, and problem list.  Review of Systems A comprehensive review of systems was negative.  Objective:  BP 124/85   Pulse 84   Ht 5\' 2"  (1.575 m)   Wt 127 lb 9.6 oz (57.9 kg)   Breastfeeding No   BMI 23.34 kg/m    General:  alert, cooperative, and appears stated age   Breasts:  not indicated  Lungs: clear to auscultation bilaterally  Heart:  regular rate and rhythm, S1, S2 normal, no murmur, click, rub or gallop  Abdomen: Soft, non tender.    Wound Not examined. She states that pain is resolved. No concerns for infection or breakdown  GU exam:  not indicated       Assessment:    1. Hx of gestational diabetes mellitus, not currently pregnant 2. GDM (gestational diabetes mellitus), class A1  Normal postpartum exam.  Healing well overall post partum.   Plan:   Essential components of care per ACOG recommendations:  1.  Mood and well being: Patient with negative depression screening today. Reviewed local resources for support.  - Patient tobacco use? No.   - hx of drug use? No.  2. Infant care and feeding:  -Patient currently breastmilk feeding? No. Takes formula. -Social determinants of health (SDOH) reviewed in EPIC. No concerns.  3. Sexuality, contraception and birth spacing - Patient does not want a pregnancy in the next year.  Desired family size is 1 children.  - Reviewed reproductive life planning. Reviewed contraceptive methods based on pt preferences and effectiveness.  Patient has a nexplanon in place as of today.   - Discussed birth spacing of 18 months Having some ongoing bleeding, discussed side effect of nexplanon. Ok to monitor for the next 6 weeks, if still ongoing can come in and discuss further  4. Sleep and fatigue -Encouraged family/partner/community support of 4 hrs of  uninterrupted sleep to help with mood and fatigue  5. Physical Recovery  - Discussed patients delivery and complications. She describes her labor as good. - Patient had a Vaginal, no problems at delivery. Patient had a  2nd degree perineal and R labial  laceration. Perineal healing reviewed. Patient expressed understanding - Patient has urinary incontinence? No. - Patient is safe to resume physical and sexual activity  6.  Health Maintenance - HM due items addressed Yes - Last pap smear  Diagnosis  Date Value Ref Range Status  01/07/2022   Final   - Negative for intraepithelial lesion or malignancy (NILM)   Pap smear not done at today's visit.  -Breast Cancer screening indicated? No.   7. Chronic Disease/Pregnancy Condition follow up: Gestational Diabetes - had diet controlled GDM. Getting 2hr GTT today.  Discussed increased risk of T2DM later in life - ensure healthy diet, frequent exercise and diabetes screening at least once in 3 years.  - PCP follow up  Sheppard Evens MD MPH OB Fellow, Faculty Practice [facility], [facility] 08/05/2022

## 2022-08-06 LAB — GLUCOSE TOLERANCE, 2 HOURS
Glucose, 2 hour: 97 mg/dL (ref 70–139)
Glucose, GTT - Fasting: 95 mg/dL (ref 70–99)

## 2022-08-18 ENCOUNTER — Encounter: Payer: Self-pay | Admitting: *Deleted
